# Patient Record
Sex: Female | Born: 1965 | Race: Black or African American | Hispanic: No | Marital: Single | Smoking: Never smoker
Health system: Southern US, Community
[De-identification: ages and names within clinical notes are randomized; demographics above are authoritative.]

## PROBLEM LIST (undated history)

## (undated) DIAGNOSIS — I10 Essential (primary) hypertension: Secondary | ICD-10-CM

## (undated) DIAGNOSIS — D649 Anemia, unspecified: Secondary | ICD-10-CM

## (undated) DIAGNOSIS — M76899 Other specified enthesopathies of unspecified lower limb, excluding foot: Secondary | ICD-10-CM

## (undated) DIAGNOSIS — Z87442 Personal history of urinary calculi: Secondary | ICD-10-CM

## (undated) DIAGNOSIS — E119 Type 2 diabetes mellitus without complications: Secondary | ICD-10-CM

## (undated) DIAGNOSIS — R569 Unspecified convulsions: Secondary | ICD-10-CM

## (undated) DIAGNOSIS — R519 Headache, unspecified: Secondary | ICD-10-CM

## (undated) DIAGNOSIS — IMO0001 Reserved for inherently not codable concepts without codable children: Secondary | ICD-10-CM

## (undated) DIAGNOSIS — M47816 Spondylosis without myelopathy or radiculopathy, lumbar region: Secondary | ICD-10-CM

## (undated) DIAGNOSIS — M5137 Other intervertebral disc degeneration, lumbosacral region: Secondary | ICD-10-CM

## (undated) DIAGNOSIS — M2170 Unequal limb length (acquired), unspecified site: Secondary | ICD-10-CM

## (undated) DIAGNOSIS — R42 Dizziness and giddiness: Secondary | ICD-10-CM

## (undated) DIAGNOSIS — M51379 Other intervertebral disc degeneration, lumbosacral region without mention of lumbar back pain or lower extremity pain: Secondary | ICD-10-CM

## (undated) DIAGNOSIS — K219 Gastro-esophageal reflux disease without esophagitis: Secondary | ICD-10-CM

## (undated) DIAGNOSIS — F419 Anxiety disorder, unspecified: Secondary | ICD-10-CM

## (undated) DIAGNOSIS — M533 Sacrococcygeal disorders, not elsewhere classified: Secondary | ICD-10-CM

## (undated) HISTORY — DX: Anxiety disorder, unspecified: F41.9

## (undated) HISTORY — DX: Other intervertebral disc degeneration, lumbosacral region: M51.37

## (undated) HISTORY — PX: CARPAL TUNNEL RELEASE: SHX101

## (undated) HISTORY — DX: Essential (primary) hypertension: I10

## (undated) HISTORY — DX: Other intervertebral disc degeneration, lumbosacral region without mention of lumbar back pain or lower extremity pain: M51.379

## (undated) HISTORY — DX: Reserved for inherently not codable concepts without codable children: IMO0001

## (undated) HISTORY — DX: Gastro-esophageal reflux disease without esophagitis: K21.9

## (undated) HISTORY — DX: Dizziness and giddiness: R42

## (undated) HISTORY — PX: CHOLECYSTECTOMY: SHX55

## (undated) HISTORY — PX: HERNIA REPAIR: SHX51

## (undated) HISTORY — DX: Unspecified convulsions: R56.9

## (undated) HISTORY — DX: Unequal limb length (acquired), unspecified site: M21.70

## (undated) HISTORY — PX: ABDOMINAL HYSTERECTOMY: SHX81

## (undated) HISTORY — DX: Sacrococcygeal disorders, not elsewhere classified: M53.3

## (undated) HISTORY — DX: Other specified enthesopathies of unspecified lower limb, excluding foot: M76.899

## (undated) HISTORY — DX: Headache, unspecified: R51.9

## (undated) HISTORY — PX: APPENDECTOMY: SHX54

## (undated) HISTORY — PX: FOOT SURGERY: SHX648

## (undated) HISTORY — DX: Type 2 diabetes mellitus without complications: E11.9

## (undated) HISTORY — DX: Spondylosis without myelopathy or radiculopathy, lumbar region: M47.816

---

## 2002-02-06 ENCOUNTER — Encounter: Admission: RE | Admit: 2002-02-06 | Discharge: 2002-02-06 | Payer: Self-pay | Admitting: Internal Medicine

## 2005-10-31 ENCOUNTER — Encounter: Admission: RE | Admit: 2005-10-31 | Discharge: 2005-10-31 | Payer: Self-pay | Admitting: Family Medicine

## 2005-12-15 ENCOUNTER — Encounter: Admission: RE | Admit: 2005-12-15 | Discharge: 2005-12-15 | Payer: Self-pay | Admitting: Otolaryngology

## 2006-01-18 ENCOUNTER — Ambulatory Visit: Payer: Self-pay | Admitting: Gastroenterology

## 2006-01-20 ENCOUNTER — Ambulatory Visit: Payer: Self-pay | Admitting: Gastroenterology

## 2006-01-20 ENCOUNTER — Encounter: Payer: Self-pay | Admitting: Gastroenterology

## 2006-02-01 ENCOUNTER — Ambulatory Visit (HOSPITAL_COMMUNITY): Admission: RE | Admit: 2006-02-01 | Discharge: 2006-02-01 | Payer: Self-pay | Admitting: Gastroenterology

## 2006-02-14 ENCOUNTER — Ambulatory Visit: Payer: Self-pay | Admitting: Gastroenterology

## 2006-02-14 ENCOUNTER — Ambulatory Visit: Payer: Self-pay | Admitting: Internal Medicine

## 2006-02-21 ENCOUNTER — Ambulatory Visit: Payer: Self-pay | Admitting: Gastroenterology

## 2006-04-05 ENCOUNTER — Ambulatory Visit: Payer: Self-pay | Admitting: Gastroenterology

## 2006-04-06 ENCOUNTER — Encounter (INDEPENDENT_AMBULATORY_CARE_PROVIDER_SITE_OTHER): Payer: Self-pay | Admitting: *Deleted

## 2006-04-06 ENCOUNTER — Ambulatory Visit: Payer: Self-pay | Admitting: Gastroenterology

## 2006-04-18 ENCOUNTER — Encounter (INDEPENDENT_AMBULATORY_CARE_PROVIDER_SITE_OTHER): Payer: Self-pay | Admitting: *Deleted

## 2006-04-18 ENCOUNTER — Inpatient Hospital Stay (HOSPITAL_COMMUNITY): Admission: RE | Admit: 2006-04-18 | Discharge: 2006-04-23 | Payer: Self-pay | Admitting: General Surgery

## 2006-05-04 ENCOUNTER — Ambulatory Visit: Payer: Self-pay | Admitting: Gastroenterology

## 2006-11-01 ENCOUNTER — Ambulatory Visit: Payer: Self-pay | Admitting: Gastroenterology

## 2006-11-01 LAB — CONVERTED CEMR LAB
Alkaline Phosphatase: 70 units/L (ref 39–117)
Basophils Relative: 0.6 % (ref 0.0–1.0)
Bilirubin Urine: NEGATIVE
Bilirubin, Direct: 0.1 mg/dL (ref 0.0–0.3)
CO2: 29 meq/L (ref 19–32)
Creatinine, Ser: 0.7 mg/dL (ref 0.4–1.2)
Eosinophils Relative: 1.7 % (ref 0.0–5.0)
GFR calc Af Amer: 118 mL/min
Glucose, Bld: 82 mg/dL (ref 70–99)
HCT: 35.7 % — ABNORMAL LOW (ref 36.0–46.0)
Hemoglobin: 12.6 g/dL (ref 12.0–15.0)
Leukocytes, UA: NEGATIVE
Lymphocytes Relative: 35 % (ref 12.0–46.0)
Monocytes Absolute: 0.3 10*3/uL (ref 0.2–0.7)
Monocytes Relative: 4.5 % (ref 3.0–11.0)
Neutro Abs: 3.4 10*3/uL (ref 1.4–7.7)
Neutrophils Relative %: 58.2 % (ref 43.0–77.0)
Potassium: 4.1 meq/L (ref 3.5–5.1)
RDW: 13 % (ref 11.5–14.6)
Sodium: 142 meq/L (ref 135–145)
Specific Gravity, Urine: 1.025 (ref 1.000–1.03)
Total Bilirubin: 0.6 mg/dL (ref 0.3–1.2)
Total Protein, Urine: NEGATIVE mg/dL
Total Protein: 7.2 g/dL (ref 6.0–8.3)
Urobilinogen, UA: 0.2 (ref 0.0–1.0)
WBC: 5.9 10*3/uL (ref 4.5–10.5)
pH: 6.5 (ref 5.0–8.0)

## 2006-11-03 ENCOUNTER — Ambulatory Visit: Payer: Self-pay | Admitting: Internal Medicine

## 2007-03-15 ENCOUNTER — Ambulatory Visit: Payer: Self-pay | Admitting: Gastroenterology

## 2007-04-10 ENCOUNTER — Ambulatory Visit: Payer: Self-pay | Admitting: Gastroenterology

## 2007-04-12 ENCOUNTER — Encounter: Payer: Self-pay | Admitting: *Deleted

## 2007-04-12 ENCOUNTER — Encounter: Payer: Self-pay | Admitting: Endocrinology

## 2007-04-12 ENCOUNTER — Ambulatory Visit: Payer: Self-pay | Admitting: Endocrinology

## 2007-04-12 DIAGNOSIS — E785 Hyperlipidemia, unspecified: Secondary | ICD-10-CM | POA: Insufficient documentation

## 2007-04-12 DIAGNOSIS — D649 Anemia, unspecified: Secondary | ICD-10-CM

## 2007-04-12 DIAGNOSIS — Z860100 Personal history of colon polyps, unspecified: Secondary | ICD-10-CM | POA: Insufficient documentation

## 2007-04-12 DIAGNOSIS — R5382 Chronic fatigue, unspecified: Secondary | ICD-10-CM

## 2007-04-12 DIAGNOSIS — R7309 Other abnormal glucose: Secondary | ICD-10-CM

## 2007-04-12 DIAGNOSIS — E229 Hyperfunction of pituitary gland, unspecified: Secondary | ICD-10-CM

## 2007-04-12 DIAGNOSIS — E039 Hypothyroidism, unspecified: Secondary | ICD-10-CM | POA: Insufficient documentation

## 2007-05-09 ENCOUNTER — Ambulatory Visit: Payer: Self-pay | Admitting: Endocrinology

## 2007-05-11 LAB — CONVERTED CEMR LAB
Sed Rate: 17 mm/hr (ref 0–25)
TSH: 0.24 microintl units/mL — ABNORMAL LOW (ref 0.35–5.50)

## 2007-05-16 ENCOUNTER — Telehealth (INDEPENDENT_AMBULATORY_CARE_PROVIDER_SITE_OTHER): Payer: Self-pay | Admitting: *Deleted

## 2007-05-17 ENCOUNTER — Ambulatory Visit: Payer: Self-pay | Admitting: Endocrinology

## 2007-05-18 LAB — CONVERTED CEMR LAB
ALT: 15 units/L (ref 0–35)
AST: 17 units/L (ref 0–37)
Alkaline Phosphatase: 86 units/L (ref 39–117)
BUN: 8 mg/dL (ref 6–23)
Basophils Relative: 0 % (ref 0.0–1.0)
Bilirubin Urine: NEGATIVE
Bilirubin, Direct: 0.1 mg/dL (ref 0.0–0.3)
CO2: 32 meq/L (ref 19–32)
Calcium: 10.1 mg/dL (ref 8.4–10.5)
Creatinine, Ser: 0.6 mg/dL (ref 0.4–1.2)
Eosinophils Relative: 1.9 % (ref 0.0–5.0)
Glucose, Bld: 90 mg/dL (ref 70–99)
Hemoglobin: 13.4 g/dL (ref 12.0–15.0)
Leukocytes, UA: NEGATIVE
Monocytes Relative: 3.5 % (ref 3.0–11.0)
Nitrite: NEGATIVE
Platelets: 264 10*3/uL (ref 150–400)
RDW: 12.2 % (ref 11.5–14.6)
Specific Gravity, Urine: 1.02 (ref 1.000–1.03)
Total Bilirubin: 0.7 mg/dL (ref 0.3–1.2)
Total Protein, Urine: NEGATIVE mg/dL
Total Protein: 8 g/dL (ref 6.0–8.3)
Triglycerides: 122 mg/dL (ref 0–149)
Urine Glucose: NEGATIVE mg/dL
VLDL: 24 mg/dL (ref 0–40)
WBC: 8.4 10*3/uL (ref 4.5–10.5)
pH: 7 (ref 5.0–8.0)

## 2007-05-22 ENCOUNTER — Encounter: Payer: Self-pay | Admitting: Endocrinology

## 2007-05-22 ENCOUNTER — Other Ambulatory Visit: Admission: RE | Admit: 2007-05-22 | Discharge: 2007-05-22 | Payer: Self-pay | Admitting: Endocrinology

## 2007-05-22 ENCOUNTER — Telehealth (INDEPENDENT_AMBULATORY_CARE_PROVIDER_SITE_OTHER): Payer: Self-pay | Admitting: *Deleted

## 2007-05-22 ENCOUNTER — Ambulatory Visit: Payer: Self-pay | Admitting: Endocrinology

## 2007-05-22 DIAGNOSIS — J069 Acute upper respiratory infection, unspecified: Secondary | ICD-10-CM | POA: Insufficient documentation

## 2007-05-28 ENCOUNTER — Ambulatory Visit: Payer: Self-pay | Admitting: Endocrinology

## 2007-05-28 ENCOUNTER — Telehealth (INDEPENDENT_AMBULATORY_CARE_PROVIDER_SITE_OTHER): Payer: Self-pay | Admitting: *Deleted

## 2007-05-28 LAB — CONVERTED CEMR LAB
Basophils Relative: 0.1 % (ref 0.0–1.0)
Eosinophils Relative: 1.9 % (ref 0.0–5.0)
HCT: 36 % (ref 36.0–46.0)
Hemoglobin: 12.2 g/dL (ref 12.0–15.0)
Iron: 91 ug/dL (ref 42–145)
Lymphocytes Relative: 37.5 % (ref 12.0–46.0)
Monocytes Absolute: 0.2 10*3/uL (ref 0.2–0.7)
Monocytes Relative: 2.6 % — ABNORMAL LOW (ref 3.0–11.0)
Neutro Abs: 3.8 10*3/uL (ref 1.4–7.7)
Neutrophils Relative %: 57.9 % (ref 43.0–77.0)
RDW: 12.3 % (ref 11.5–14.6)

## 2007-06-18 ENCOUNTER — Ambulatory Visit: Payer: Self-pay | Admitting: Endocrinology

## 2007-06-21 LAB — CONVERTED CEMR LAB: TSH: 1.37 microintl units/mL (ref 0.35–5.50)

## 2007-07-17 ENCOUNTER — Encounter: Payer: Self-pay | Admitting: Internal Medicine

## 2007-07-19 ENCOUNTER — Telehealth (INDEPENDENT_AMBULATORY_CARE_PROVIDER_SITE_OTHER): Payer: Self-pay | Admitting: *Deleted

## 2007-07-20 ENCOUNTER — Ambulatory Visit: Payer: Self-pay | Admitting: Internal Medicine

## 2007-07-20 DIAGNOSIS — M546 Pain in thoracic spine: Secondary | ICD-10-CM

## 2007-07-20 DIAGNOSIS — K811 Chronic cholecystitis: Secondary | ICD-10-CM | POA: Insufficient documentation

## 2007-07-20 DIAGNOSIS — Z87442 Personal history of urinary calculi: Secondary | ICD-10-CM

## 2007-07-20 DIAGNOSIS — IMO0002 Reserved for concepts with insufficient information to code with codable children: Secondary | ICD-10-CM

## 2007-07-23 LAB — CONVERTED CEMR LAB
Basophils Relative: 0 % (ref 0.0–1.0)
Crystals: NEGATIVE
Eosinophils Absolute: 0.1 10*3/uL (ref 0.0–0.6)
Eosinophils Relative: 1.2 % (ref 0.0–5.0)
Folate: 9.7 ng/mL
HCT: 36.1 % (ref 36.0–46.0)
Hemoglobin, Urine: NEGATIVE
Hemoglobin: 12.1 g/dL (ref 12.0–15.0)
Leukocytes, UA: NEGATIVE
Lymphocytes Relative: 46.2 % — ABNORMAL HIGH (ref 12.0–46.0)
MCV: 88.2 fL (ref 78.0–100.0)
Monocytes Absolute: 0.7 10*3/uL (ref 0.2–0.7)
Neutro Abs: 3.5 10*3/uL (ref 1.4–7.7)
Neutrophils Relative %: 44.3 % (ref 43.0–77.0)
Platelets: 211 10*3/uL (ref 150–400)
Saturation Ratios: 15.7 % — ABNORMAL LOW (ref 20.0–50.0)
Total Protein, Urine: NEGATIVE mg/dL
Transferrin: 290.3 mg/dL (ref >=212.0)
Urine Glucose: NEGATIVE mg/dL
Urobilinogen, UA: 0.2 (ref 0.0–1.0)
Vitamin B-12: 322 pg/mL (ref 211–911)
WBC: 8 10*3/uL (ref 4.5–10.5)

## 2007-07-25 ENCOUNTER — Encounter: Payer: Self-pay | Admitting: Internal Medicine

## 2007-08-03 ENCOUNTER — Encounter: Admission: RE | Admit: 2007-08-03 | Discharge: 2007-08-03 | Payer: Self-pay | Admitting: Internal Medicine

## 2007-08-06 ENCOUNTER — Telehealth: Payer: Self-pay | Admitting: Internal Medicine

## 2007-08-07 ENCOUNTER — Telehealth: Payer: Self-pay | Admitting: Internal Medicine

## 2007-08-08 ENCOUNTER — Encounter: Admission: RE | Admit: 2007-08-08 | Discharge: 2007-08-08 | Payer: Self-pay | Admitting: Specialist

## 2007-08-10 ENCOUNTER — Ambulatory Visit: Payer: Self-pay | Admitting: Endocrinology

## 2007-08-13 LAB — CONVERTED CEMR LAB: TSH: 1.91 microintl units/mL (ref 0.35–5.50)

## 2007-08-17 ENCOUNTER — Encounter: Payer: Self-pay | Admitting: Internal Medicine

## 2007-08-17 ENCOUNTER — Telehealth (INDEPENDENT_AMBULATORY_CARE_PROVIDER_SITE_OTHER): Payer: Self-pay | Admitting: *Deleted

## 2007-08-22 ENCOUNTER — Encounter: Payer: Self-pay | Admitting: Internal Medicine

## 2007-09-05 ENCOUNTER — Telehealth (INDEPENDENT_AMBULATORY_CARE_PROVIDER_SITE_OTHER): Payer: Self-pay | Admitting: *Deleted

## 2007-09-11 ENCOUNTER — Ambulatory Visit: Payer: Self-pay | Admitting: Internal Medicine

## 2007-09-11 DIAGNOSIS — F411 Generalized anxiety disorder: Secondary | ICD-10-CM | POA: Insufficient documentation

## 2007-09-11 DIAGNOSIS — M2550 Pain in unspecified joint: Secondary | ICD-10-CM | POA: Insufficient documentation

## 2007-09-11 DIAGNOSIS — F329 Major depressive disorder, single episode, unspecified: Secondary | ICD-10-CM

## 2007-09-13 ENCOUNTER — Encounter: Payer: Self-pay | Admitting: Internal Medicine

## 2007-10-03 ENCOUNTER — Encounter
Admission: RE | Admit: 2007-10-03 | Discharge: 2008-01-01 | Payer: Self-pay | Admitting: Physical Medicine & Rehabilitation

## 2007-10-04 ENCOUNTER — Telehealth (INDEPENDENT_AMBULATORY_CARE_PROVIDER_SITE_OTHER): Payer: Self-pay | Admitting: *Deleted

## 2007-10-05 ENCOUNTER — Ambulatory Visit: Payer: Self-pay | Admitting: Physical Medicine & Rehabilitation

## 2007-11-12 ENCOUNTER — Ambulatory Visit: Payer: Self-pay | Admitting: Physical Medicine & Rehabilitation

## 2007-12-13 ENCOUNTER — Ambulatory Visit: Payer: Self-pay | Admitting: Physical Medicine & Rehabilitation

## 2007-12-13 ENCOUNTER — Encounter
Admission: RE | Admit: 2007-12-13 | Discharge: 2008-03-12 | Payer: Self-pay | Admitting: Physical Medicine & Rehabilitation

## 2008-01-07 ENCOUNTER — Ambulatory Visit: Payer: Self-pay | Admitting: Physical Medicine & Rehabilitation

## 2008-01-31 ENCOUNTER — Ambulatory Visit: Payer: Self-pay | Admitting: Physical Medicine & Rehabilitation

## 2008-02-28 ENCOUNTER — Ambulatory Visit: Payer: Self-pay | Admitting: Physical Medicine & Rehabilitation

## 2008-03-31 ENCOUNTER — Encounter
Admission: RE | Admit: 2008-03-31 | Discharge: 2008-06-29 | Payer: Self-pay | Admitting: Physical Medicine & Rehabilitation

## 2008-04-01 ENCOUNTER — Ambulatory Visit: Payer: Self-pay | Admitting: Physical Medicine & Rehabilitation

## 2008-04-29 ENCOUNTER — Ambulatory Visit: Payer: Self-pay | Admitting: Physical Medicine & Rehabilitation

## 2008-05-28 ENCOUNTER — Ambulatory Visit: Payer: Self-pay | Admitting: Physical Medicine & Rehabilitation

## 2008-06-30 ENCOUNTER — Ambulatory Visit: Payer: Self-pay | Admitting: Physical Medicine & Rehabilitation

## 2008-06-30 ENCOUNTER — Encounter
Admission: RE | Admit: 2008-06-30 | Discharge: 2008-06-30 | Payer: Self-pay | Admitting: Physical Medicine & Rehabilitation

## 2008-08-28 ENCOUNTER — Encounter
Admission: RE | Admit: 2008-08-28 | Discharge: 2008-11-26 | Payer: Self-pay | Admitting: Physical Medicine & Rehabilitation

## 2008-08-29 ENCOUNTER — Ambulatory Visit: Payer: Self-pay | Admitting: Physical Medicine & Rehabilitation

## 2008-09-30 ENCOUNTER — Ambulatory Visit: Payer: Self-pay | Admitting: Physical Medicine & Rehabilitation

## 2008-10-29 ENCOUNTER — Ambulatory Visit: Payer: Self-pay | Admitting: Physical Medicine & Rehabilitation

## 2008-12-02 ENCOUNTER — Encounter
Admission: RE | Admit: 2008-12-02 | Discharge: 2009-03-02 | Payer: Self-pay | Admitting: Physical Medicine & Rehabilitation

## 2008-12-05 ENCOUNTER — Ambulatory Visit: Payer: Self-pay | Admitting: Physical Medicine & Rehabilitation

## 2008-12-11 ENCOUNTER — Ambulatory Visit (HOSPITAL_COMMUNITY)
Admission: RE | Admit: 2008-12-11 | Discharge: 2008-12-11 | Payer: Self-pay | Admitting: Physical Medicine & Rehabilitation

## 2008-12-26 ENCOUNTER — Ambulatory Visit: Payer: Self-pay | Admitting: Physical Medicine & Rehabilitation

## 2009-01-23 ENCOUNTER — Ambulatory Visit: Payer: Self-pay | Admitting: Physical Medicine & Rehabilitation

## 2009-02-18 ENCOUNTER — Ambulatory Visit: Payer: Self-pay | Admitting: Physical Medicine & Rehabilitation

## 2009-03-11 ENCOUNTER — Encounter
Admission: RE | Admit: 2009-03-11 | Discharge: 2009-06-09 | Payer: Self-pay | Admitting: Physical Medicine & Rehabilitation

## 2009-03-18 ENCOUNTER — Ambulatory Visit: Payer: Self-pay | Admitting: Physical Medicine & Rehabilitation

## 2009-03-19 ENCOUNTER — Encounter (INDEPENDENT_AMBULATORY_CARE_PROVIDER_SITE_OTHER): Payer: Self-pay | Admitting: *Deleted

## 2009-04-27 ENCOUNTER — Ambulatory Visit: Payer: Self-pay | Admitting: Physical Medicine & Rehabilitation

## 2009-05-15 ENCOUNTER — Encounter
Admission: RE | Admit: 2009-05-15 | Discharge: 2009-06-24 | Payer: Self-pay | Admitting: Physical Medicine & Rehabilitation

## 2009-05-18 ENCOUNTER — Ambulatory Visit: Payer: Self-pay | Admitting: Physical Medicine & Rehabilitation

## 2009-06-16 ENCOUNTER — Encounter
Admission: RE | Admit: 2009-06-16 | Discharge: 2009-06-24 | Payer: Self-pay | Admitting: Physical Medicine & Rehabilitation

## 2009-06-19 ENCOUNTER — Ambulatory Visit: Payer: Self-pay | Admitting: Physical Medicine & Rehabilitation

## 2009-07-16 ENCOUNTER — Encounter
Admission: RE | Admit: 2009-07-16 | Discharge: 2009-10-14 | Payer: Self-pay | Admitting: Physical Medicine & Rehabilitation

## 2009-07-17 ENCOUNTER — Ambulatory Visit: Payer: Self-pay | Admitting: Physical Medicine & Rehabilitation

## 2009-08-14 ENCOUNTER — Ambulatory Visit: Payer: Self-pay | Admitting: Physical Medicine & Rehabilitation

## 2009-09-10 ENCOUNTER — Encounter
Admission: RE | Admit: 2009-09-10 | Discharge: 2009-12-09 | Payer: Self-pay | Admitting: Physical Medicine & Rehabilitation

## 2009-09-14 ENCOUNTER — Ambulatory Visit: Payer: Self-pay | Admitting: Physical Medicine & Rehabilitation

## 2009-10-21 ENCOUNTER — Ambulatory Visit: Payer: Self-pay | Admitting: Physical Medicine & Rehabilitation

## 2009-11-19 ENCOUNTER — Ambulatory Visit: Payer: Self-pay | Admitting: Physical Medicine & Rehabilitation

## 2009-11-20 ENCOUNTER — Telehealth: Payer: Self-pay | Admitting: Gastroenterology

## 2009-12-18 ENCOUNTER — Encounter
Admission: RE | Admit: 2009-12-18 | Discharge: 2010-01-18 | Payer: Self-pay | Admitting: Physical Medicine & Rehabilitation

## 2009-12-23 ENCOUNTER — Ambulatory Visit: Payer: Self-pay | Admitting: Physical Medicine & Rehabilitation

## 2010-01-18 ENCOUNTER — Ambulatory Visit: Payer: Self-pay | Admitting: Physical Medicine & Rehabilitation

## 2010-02-09 ENCOUNTER — Encounter
Admission: RE | Admit: 2010-02-09 | Discharge: 2010-03-17 | Payer: Self-pay | Source: Home / Self Care | Admitting: Physical Medicine & Rehabilitation

## 2010-02-18 ENCOUNTER — Ambulatory Visit: Payer: Self-pay | Admitting: Physical Medicine & Rehabilitation

## 2010-03-17 ENCOUNTER — Encounter
Admission: RE | Admit: 2010-03-17 | Discharge: 2010-06-15 | Payer: Self-pay | Source: Home / Self Care | Attending: Physical Medicine & Rehabilitation | Admitting: Physical Medicine & Rehabilitation

## 2010-03-23 ENCOUNTER — Ambulatory Visit: Payer: Self-pay | Admitting: Physical Medicine & Rehabilitation

## 2010-04-22 ENCOUNTER — Ambulatory Visit: Payer: Self-pay | Admitting: Physical Medicine & Rehabilitation

## 2010-05-18 ENCOUNTER — Ambulatory Visit: Payer: Self-pay | Admitting: Physical Medicine & Rehabilitation

## 2010-06-17 ENCOUNTER — Encounter
Admission: RE | Admit: 2010-06-17 | Discharge: 2010-07-19 | Payer: Self-pay | Source: Home / Self Care | Attending: Physical Medicine & Rehabilitation | Admitting: Physical Medicine & Rehabilitation

## 2010-06-17 ENCOUNTER — Ambulatory Visit: Payer: Self-pay | Admitting: Physical Medicine & Rehabilitation

## 2010-07-19 ENCOUNTER — Encounter
Admission: RE | Admit: 2010-07-19 | Discharge: 2010-07-21 | Payer: Self-pay | Source: Home / Self Care | Attending: Physical Medicine & Rehabilitation | Admitting: Physical Medicine & Rehabilitation

## 2010-07-21 ENCOUNTER — Ambulatory Visit
Admission: RE | Admit: 2010-07-21 | Discharge: 2010-07-21 | Payer: Self-pay | Source: Home / Self Care | Attending: Physical Medicine & Rehabilitation | Admitting: Physical Medicine & Rehabilitation

## 2010-08-01 LAB — CONVERTED CEMR LAB
AST: 16 units/L (ref 0–37)
Albumin: 3.6 g/dL (ref 3.5–5.2)
Basophils Absolute: 0 10*3/uL (ref 0.0–0.1)
Bilirubin, Direct: 0.1 mg/dL (ref 0.0–0.3)
Calcium: 9 mg/dL (ref 8.4–10.5)
Chloride: 101 meq/L (ref 96–112)
Eosinophils Absolute: 0.2 10*3/uL (ref 0.0–0.6)
GFR calc Af Amer: 118 mL/min
GFR calc non Af Amer: 98 mL/min
HCT: 33.2 % — ABNORMAL LOW (ref 36.0–46.0)
HDL: 47.6 mg/dL (ref >39.0)
Hemoglobin: 11.7 g/dL — ABNORMAL LOW (ref 12.0–15.0)
LDL Cholesterol: 77 mg/dL (ref 0–99)
Neutro Abs: 6.5 10*3/uL (ref 1.4–7.7)
Neutrophils Relative %: 69.5 % (ref 43.0–77.0)
Platelets: 221 10*3/uL (ref 150–400)
RBC: 3.7 M/uL — ABNORMAL LOW (ref 3.87–5.11)
Sodium: 139 meq/L (ref 135–145)
TSH: 0.64 microintl units/mL (ref 0.35–5.50)
VLDL: 18 mg/dL (ref 0–40)
WBC: 9.3 10*3/uL (ref 4.5–10.5)

## 2010-08-03 NOTE — Progress Notes (Signed)
Summary: CHANGE TO [facility]---Colonoscopy   Phone Note Outgoing Call   Call placed by: Lamona Curl CMA Duncan Dull),  Nov 20, 2009 1:13 PM Call placed to: Patient Summary of Call: I have spoken to patient to advise her that she is overdue for her colonoscopy. Patient has history of large adenomatous colon polyp and hemicolectomy. Patient states that she has had surgery recently and that she sees a GI Dr at [facility]. I have advised her that she should follow up with them for her colonoscopy. Patient states that she never got our letter and would like [place] to send her a copy of the original so she can take it to [facility]. I have updated her address and will send out the letter to her.  Initial call taken by: Lamona Curl CMA (AAMA),  Nov 20, 2009 1:17 PM

## 2010-08-19 ENCOUNTER — Ambulatory Visit (HOSPITAL_BASED_OUTPATIENT_CLINIC_OR_DEPARTMENT_OTHER): Payer: Self-pay

## 2010-08-19 ENCOUNTER — Encounter: Payer: Medicare Other | Attending: Physical Medicine & Rehabilitation

## 2010-08-19 DIAGNOSIS — M2170 Unequal limb length (acquired), unspecified site: Secondary | ICD-10-CM

## 2010-08-19 DIAGNOSIS — M129 Arthropathy, unspecified: Secondary | ICD-10-CM | POA: Insufficient documentation

## 2010-08-19 DIAGNOSIS — M79609 Pain in unspecified limb: Secondary | ICD-10-CM | POA: Insufficient documentation

## 2010-08-19 DIAGNOSIS — IMO0001 Reserved for inherently not codable concepts without codable children: Secondary | ICD-10-CM | POA: Insufficient documentation

## 2010-08-19 DIAGNOSIS — M5137 Other intervertebral disc degeneration, lumbosacral region: Secondary | ICD-10-CM

## 2010-08-19 DIAGNOSIS — M76899 Other specified enthesopathies of unspecified lower limb, excluding foot: Secondary | ICD-10-CM

## 2010-08-19 DIAGNOSIS — M7660 Achilles tendinitis, unspecified leg: Secondary | ICD-10-CM | POA: Insufficient documentation

## 2010-08-19 DIAGNOSIS — G8929 Other chronic pain: Secondary | ICD-10-CM | POA: Insufficient documentation

## 2010-08-19 DIAGNOSIS — M533 Sacrococcygeal disorders, not elsewhere classified: Secondary | ICD-10-CM

## 2010-09-16 ENCOUNTER — Ambulatory Visit: Payer: Medicare Other

## 2010-09-16 ENCOUNTER — Encounter: Payer: Medicare Other | Attending: Physical Medicine & Rehabilitation

## 2010-09-16 DIAGNOSIS — Z981 Arthrodesis status: Secondary | ICD-10-CM | POA: Insufficient documentation

## 2010-09-16 DIAGNOSIS — M5137 Other intervertebral disc degeneration, lumbosacral region: Secondary | ICD-10-CM

## 2010-09-16 DIAGNOSIS — M545 Low back pain, unspecified: Secondary | ICD-10-CM | POA: Insufficient documentation

## 2010-09-16 DIAGNOSIS — M51379 Other intervertebral disc degeneration, lumbosacral region without mention of lumbar back pain or lower extremity pain: Secondary | ICD-10-CM

## 2010-09-16 DIAGNOSIS — M2170 Unequal limb length (acquired), unspecified site: Secondary | ICD-10-CM

## 2010-09-16 DIAGNOSIS — M7660 Achilles tendinitis, unspecified leg: Secondary | ICD-10-CM | POA: Insufficient documentation

## 2010-09-16 DIAGNOSIS — M79609 Pain in unspecified limb: Secondary | ICD-10-CM | POA: Insufficient documentation

## 2010-09-16 DIAGNOSIS — M76899 Other specified enthesopathies of unspecified lower limb, excluding foot: Secondary | ICD-10-CM

## 2010-09-16 DIAGNOSIS — M533 Sacrococcygeal disorders, not elsewhere classified: Secondary | ICD-10-CM

## 2010-09-16 DIAGNOSIS — IMO0001 Reserved for inherently not codable concepts without codable children: Secondary | ICD-10-CM | POA: Insufficient documentation

## 2010-09-16 DIAGNOSIS — M129 Arthropathy, unspecified: Secondary | ICD-10-CM | POA: Insufficient documentation

## 2010-09-16 DIAGNOSIS — G8929 Other chronic pain: Secondary | ICD-10-CM | POA: Insufficient documentation

## 2010-10-20 ENCOUNTER — Encounter: Payer: Medicare Other | Attending: Physical Medicine & Rehabilitation | Admitting: Physical Medicine & Rehabilitation

## 2010-10-20 DIAGNOSIS — M722 Plantar fascial fibromatosis: Secondary | ICD-10-CM | POA: Insufficient documentation

## 2010-10-20 DIAGNOSIS — M7660 Achilles tendinitis, unspecified leg: Secondary | ICD-10-CM | POA: Insufficient documentation

## 2010-10-20 DIAGNOSIS — M129 Arthropathy, unspecified: Secondary | ICD-10-CM | POA: Insufficient documentation

## 2010-10-20 DIAGNOSIS — M76899 Other specified enthesopathies of unspecified lower limb, excluding foot: Secondary | ICD-10-CM

## 2010-10-20 DIAGNOSIS — M2170 Unequal limb length (acquired), unspecified site: Secondary | ICD-10-CM

## 2010-10-20 DIAGNOSIS — M5137 Other intervertebral disc degeneration, lumbosacral region: Secondary | ICD-10-CM

## 2010-10-20 DIAGNOSIS — M545 Low back pain, unspecified: Secondary | ICD-10-CM | POA: Insufficient documentation

## 2010-10-20 DIAGNOSIS — IMO0001 Reserved for inherently not codable concepts without codable children: Secondary | ICD-10-CM | POA: Insufficient documentation

## 2010-10-20 DIAGNOSIS — G8929 Other chronic pain: Secondary | ICD-10-CM | POA: Insufficient documentation

## 2010-10-20 DIAGNOSIS — M533 Sacrococcygeal disorders, not elsewhere classified: Secondary | ICD-10-CM

## 2010-10-20 DIAGNOSIS — M79609 Pain in unspecified limb: Secondary | ICD-10-CM | POA: Insufficient documentation

## 2010-10-21 NOTE — Assessment & Plan Note (Signed)
Kathy [name] is back regarding her chronic low back pain and fibromyalgia.  She has been having some problems with constipation and Surgery has talked to her about an ostomy.  She really has not been regular with her bowel program.  She recently had her Opana ET filled and states that the formulation was changed and she is having nausea.  She has been on only for 2 days.  She is also suffering from sinus allergies and issues related to the medication side effects too.  She uses oxycodone for breakthrough pain.  Her pain in the neck as well as low back and to legs.  She is having surgery done on a cyst on the left wrist as well as for potential carpal tunnel syndrome.  I am not privy to the details there.  REVIEW OF SYSTEMS:  Notable for the above.  She also reports some abdominal pain, poor appetite, fever, chills, weakness.  Full 12-point review is in the written health and history section of the chart.  SOCIAL HISTORY:  Unchanged.  She is single living with mother and brother.  She does not smoke or drink.  PHYSICAL EXAMINATION:  VITAL SIGNS:  Blood pressure is 120/84, pulse 52, respiratory rate 18, she is satting 98% on room air. GENERAL:  The patient is pleasant, alert. MUSCULOSKELETAL:  She has had pain over the right heel.  She has generalized tenderness over the lumbar paraspinals worse with flexion and palpation, right more than left.  She has fair posture.  She remains overweight.  She has abdominal pain along the surgical site.  Muscle tone is poor there.  She seemed very sleepy today as well falling asleep in the room while we talked. HEART:  Regular. CHEST:  Clear. ABDOMEN:  Soft, nontender.  ASSESSMENT: 1. Chronic low back pain related lumbar facet arthropathy. 2. Fibromyalgia. 3. Right Achilles tendinopathy and right heel pain/plantar fasciitis. 4. Multiple bowel surgeries with obstruction and colectomy. 5. History of interstitial cystitis.  PLAN: 1. Reviewed regular  bowel medication regimens with her which she     agreed to try to see if we can get her on a regular schedule. 2. Pilates and low back exercises. 3. Before we abandon the Opana, I would like to see if some of her     problems are more related to side effects or antihistamines on     recent allergy problems as well.  She had Roxicodone IR 15 and the     Opana ER 10 mg filled only a few days ago. 4. We will see her back here in about a month.  She will call me with     any problems or further nausea symptoms in a week or so. 5. Recommended continue use of heel cup for right foot.     Ranelle Oyster, M.D. Electronically Signed   ZTS/MedQ D:  10/20/2010 14:14:43  T:  10/21/2010 01:19:15  Job #:  [id number]

## 2010-11-16 DIAGNOSIS — M5430 Sciatica, unspecified side: Secondary | ICD-10-CM

## 2010-11-16 NOTE — Procedures (Signed)
Kathy [name], PONCIANO NO.:  [id number]   MEDICAL RECORD NO.:  [id number]          PATIENT TYPE:  REC   LOCATION:  [facility]                          FACILITY:  [facility]   PHYSICIAN:  Erick Colace, M.D.DATE OF BIRTH:  11/13/1965   DATE OF PROCEDURE:  01/07/2008  DATE OF DISCHARGE:                               OPERATIVE REPORT   PROCEDURE:  Bilateral L5 dorsal ramus injection, bilateral L4 medial  branch block, bilateral L3 medial branch block, and bilateral L2 medial  branch block under fluoroscopic guidance.   INDICATION:  Lumbar spondylosis, with previous good result from medial  branch blocks performed on December 13, 2007, with a 50% relief of pain  lasting for several days.   We did the right side the last time.  She feels like the left side is  also affected and would like to have this side trialed as well.  Pain  does interfere with self-care mobility and her job as a Lawyer, only  partially responsive to medication management.   PROCEDURE:  Informed consent was obtained after describing risks and  benefits of the procedure with the patient.  These include bleeding,  bruising, and infection.  She elected to proceed and has given written  consent.  The patient placed prone on fluoroscopy table.  Betadine prep  and sterile drape.  A 25-gauge 1-1/2-inch half needle was used to  anesthetize the skin and subcutaneous tissue with 1% lidocaine x2 mL,  then a 22-gauge 3-1/2-inch spinal needle was inserted under fluoroscopic  guidance, first starting in the left S1 SAP sacroiliac junction.  Bone  contact made and confirmed with lateral imaging.  Omnipaque 180 x0.5 mL  demonstrated no intravascular uptake, then 0.5 mL of solution containing  1 mL of 4 mg/mL dexamethasone and 4 mL of 1% MPF lidocaine.  Then, the  left L5 SAP-transverse process junction was targeted.  Bone contact made  and confirmed with lateral imaging.  Omnipaque 180 x0.5 mL demonstrated  no  intravascular uptake, then 0.5 mL of dexamethasone-lidocaine solution  was injected.  Then, the left L4 SAP-transverse process junction was  targeted.  Bone contact made and confirmed with lateral imaging.  Omnipaque 180 x0.5 mL demonstrated no intravascular uptake, and 0.5 mL  of  dexamethasone-lidocaine solution was injected.  Then, the left L3  SAP-transverse process junction was targeted.  Bone contact made and  confirmed with lateral imaging.  Omnipaque 180 x0.5 mL demonstrated no  intravascular uptake and 0.5 mL of dexamethasone-lidocaine solution  injected.  Then, the L2 SAP-transverse process junction targeted.  Bone  contact made and confirmed with lateral imaging.  Omnipaque 180 x0.5 mL  demonstrated no intravascular uptake, then 0.5 mL of dexamethasone-  lidocaine solution was injected.  The patient tolerated the procedure  well.  This same procedure were repeated on the right side at  corresponding levels using the same needle, injectate, and technique.  The patient tolerated the procedure well.  Pre- and post-injection  vitals were stable.  If this has efficacy similar to prior procedure, we  would set her up with right lumbar  RF at corresponding levels.      Erick Colace, M.D.  Electronically Signed    AEK/MEDQ  D:  01/07/2008 14:45:33  T:  01/08/2008 02:30:29  Job:  [id number]

## 2010-11-16 NOTE — Assessment & Plan Note (Signed)
It has been a few months since I have seen Kathy [name] last.  She has seen  with Dr. Wynn Banker for a series of lumbar medial branch blocks and RFs.  She had some temporary relief with the medial branch blocks, but she  states that the RFs helped her for about a week and week and a half, but  her pain has recurred in the low back.  She is also having some pain in  the legs, which we documented in the past.  I believe, I saw her last on  Nov 12, 2007, at which time we sent her to Dr. Wynn Banker for treatment.  She also reports over the last month and a half that she had further GI  problems and had a small bowel obstruction with partial removal of her  small intestine.  She was in the hospital for several days for this.  She has not been able to exercise much.  She remains weak, particularly  in her trunk, due to her surgery and really just not healing.  She has  had some ongoing problems with her bowel habits as a result.  She has  generalized pain in the abdomen as well as low back into the legs.  She  also complains of pain in the hands and wrists as well as the neck.  Her  family physician with thoughts of fibromyalgia in mind placed her on  Savella a month ago as well as Wellbutrin at 50 b.i.d. and 300 mg daily  respectively.  She really notes no difference with the two on board.  She had tried Lyrica, I believe in the past, unsuccessfully.  She is  using oxycodone for breakthrough pain 5 mg 1 every 12 hours p.r.n., but  does not know if this is helping much anymore.  She has tried Lidoderm  patches as well as Voltaren Gel, which helps somewhat with her low back  pain.   The patient rates her pain a 9/10 at a 10 on average.  She describes it  as sharp, burning, stabbing, tingling, and aching.  It interferes with  general activity, in relationship with others, and enjoyment of life on  a moderate-to-severe level.  Sleep is poor-to-fair.  She is limited with  walking, bending, sitting, and  standing as well as household activities.  She can walk about 10-20 minutes without having to stop in pain.  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   REVIEW OF SYSTEMS:  Notable for numbness, tremor, tingling, spasm,  dizziness, confusion, depression, loss of taste, weight gain, night  sweats, skin rash, wounds per her surgery, diarrhea, constipation,  abdominal pain, poor appetite, limb swelling of the foot and hand in  particular.  She reports some wheezing.  Full review is in the written  health and history section.   SOCIAL HISTORY:  The patient is single, living with her mother  currently.   PHYSICAL EXAMINATION:  VITAL SIGNS:  Blood pressure is 138/85, pulse is  97, and respiratory rate 18.  She is sating 100% on room air.  GENERAL:  The patient is pleasant, alert, and oriented x3.  She feels a  bit withdrawn, but overall is appropriate.  She has pain in the abdomen  today.  The wound is well healed, although she has obviously some  weakness in her abdominal musculature.  Low back is tender along the  lumbar paraspinals with flexion and extension.  She has pain  with 45  degrees of flexion and extension pain about 15 degrees today.  She had  some pain down to the PSIS area, but more of the pain was in the L5-L3  levels.  Strength was generally intact, although she had some  generalized tenderness in both upper and lower extremity limbs today.  Sensory exam was nonfocal.  She walks with a shuffling type of gait.  Cognitively, she is intact.  HEART:  Regular.  CHEST:  Clear.  ABDOMEN:  Generally soft with noted changes above.   ASSESSMENT:  1. Chronic low back and leg pain related to lumbar degenerative disk      disease and facet arthropathy.  2. Right great trochanter bursitis.  3. Obesity.  4. Multiple bowel surgeries due to adhesions, etc.  5. Hypertension.  6. Possible fibromyalgia syndrome.   PLAN:  1. I think that Savella and  Wellbutrin are reasonable ideas to treat      her centralized pain, although she has not had much effect as of      yet.  I do have some concerns of her being on Wellbutrin and the      Savella together at those high doses.  She is not having any ill      effects to this point, but she will be on a lookout.  2. We will start her on long-acting agent fentanyl patch 25 mcg every      72 hours to treat her baseline back pain and abdominal symptoms.      She will have to be very vigilant with her bowel program.  She is      taking Amitiza now per her GI team, which she will need to      continue.  She will also need look at stool softeners and laxatives      as well potentially.  3. Refill oxycodone 5 mg 1 every 8-12 hours p.r.n.  4. We will get her an outpatient physical therapy at [facility]      to work on core muscle, low back, pelvic strengthening, posture,      range of motion, etc.  I think this is paramount to her long-term      success as she has become significantly weak, deconditioning her      trunk musculature.  5. She can continue with Voltaren Gel, if she wishes, as well as      Lidoderm patches.  She can continue with Mobic as well as long as      it is okay with her GI team.  6. I will see her back in about a month to follow up progress.  She is      to call me with any problems.      Ranelle Oyster, M.D.  Electronically Signed     ZTS/MedQ  D:  04/01/2008 20:56:23  T:  04/03/2008 00:40:59  Job #:  [id number]   cc:   Corwin Levins, MD  [address]  [place]  [place] [address]

## 2010-11-16 NOTE — Assessment & Plan Note (Signed)
HISTORY OF PRESENT ILLNESS:  Kathy [name] is back regarding her multiple pain  issues and fibromyalgia.  She complains of hurting all over particularly  in the low back and in the neck and shoulder areas.  She has some  radiation of the neck pain to her head.  She saw a neurologist recently  who put her on more Amitriptyline.  She has had some problems with  vomiting for the last few weeks.  She is having very few bowel movements  overall.  When she does have a bowel movement, it is loose and of a  small amount.  Neurontin was started by [place] a month ago, although she  does not feel that this affected it, and she has been on her other  medications here for longer period.  She rates her pain today 8/10.  Describes it as sharp, stabbing, constant, tingling, and aching.  Pain  interferes with general activity, in relationship with others, and  enjoyment of life on a severe level.  Sleep is poor.  Pain increases  with walking, bending, sitting, and activity.  It improves somewhat with  medications and injections.  We discussed her injection course in the  past and she seems to have had the most benefit with the facet  blocks/medial branch blocks as opposed to the epidurals that were done  earlier in the winter.   REVIEW OF SYSTEMS:  Notable for the above as well as some numbness,  tremor, tingling, dizziness, depression, confusion, and anxiety.  Other  pertinent positives are above, and full 14-point review is in the  written health and history section.   SOCIAL HISTORY:  The patient is single, living with her mother and  brother.   PHYSICAL EXAMINATION:  VITAL SIGNS:  Blood pressure 157/98, pulse is 91,  respiratory rate 18, and she is sating 99% on room air.  GENERAL:  The patient remains overweight.  Affect is generally pleasant,  but a bit flat and quiet.  She has pain with palpation over the lower  lumbar segments, particularly L3 through L5 on the right more than left.  She also has pain  with flexion and then again with extension and facet  maneuvers today.  Rotation caused some discomfort as well.  MUSCULOSKELETAL:  Strength is 5/5 in both legs.  She has normal sensory  function essentially.  Reflexes are 1+ in both legs.  NECK:  Notable for pain along the course of the left trapezius muscle in  the upper cervical paraspinals.  With palpation, we were able to  reproduce her pain, and she had pain radiating into the head and temple  region as well with movement, particularly rotation and forward flexion  today.  NEUROLOGIC:  Stable for motor and sensory function in both upper  extremities today.  HEART:  Regular.  CHEST:  Clear.  ABDOMEN:  Soft and nontender.   ASSESSMENT:  1. Chronic lumbar spine and leg pain related to degenerative disk      disease and facet arthropathy.  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  Additionally, she      may have a component of myofascial pain in the lumbar spine and      additionally in the trapezius and cervical spinal muscles causing      pain.  2. Greater trochanteric bursitis.  3. Central pain syndrome/fibromyalgia.  4.  Multiple bowel surgeries with adhesions and now recurrent      constipation.  5. Obesity.  6. Hypothyroidism.   PLAN:  1. We reviewed the plan at length for bowel medications today.  She      needs to try a stool softener and laxative in addition to the      Amitiza that she is using.  She needs to try lactulose for more      severe symptoms, but may need to take it regularly for a while.      She may benefit from an occasional enema or suppository to help her      empty from below.  If she continues to have problems here, she      needs to discuss with her surgeon and GI physicians.  2. We refilled fentanyl patch today and oxycodone.  3. Continue with Neurontin for neuropathic symptoms.  4. After  informed consent, we injected essentially 6 separate areas      today as trigger points.  We injected the 2 spots in the right and      the left lumbar paraspinals approximately at L3 and L5.  Also, we      injected the left trapezius, the mid belly, as well as the upper      pole.  The patient tolerated it well and seemed to have some relief      before she left the office today.  We discussed further stretching      exercise, etc.  5. Pursue more workup and interventional treatments for her back pain,      pending GI symptoms.  She really needs to have her GI problems      situated first, as I think these are overall increasing her pain      due to persistent nausea, decreased quality of life, etc.  6. I will see her back in a month.      Ranelle Oyster, M.D.  Electronically Signed     ZTS/MedQ  D:  05/28/2008 11:05:17  T:  05/28/2008 22:17:35  Job #:  [id number]   cc:   Corwin Levins, MD  [address]  [place]  [place] [address]

## 2010-11-16 NOTE — Assessment & Plan Note (Signed)
HISTORY OF PRESENT ILLNESS:  Ms. Kathy [name] is back regarding her chronic  back pain and fibromyalgia.  We ordered an MRI back in June because of  symptoms of increased leg pain.  MRI was notable for some facet changes  and mild disk bulging.  She complains of pain again primarily the back  into the legs, both anterior and posteriorly.  She also has pain in her  chest as well as tingling, burning in her arms and neck.  The patient is  receiving biofeedback therapies [facility] for her GI dyssynergy.  She is trying to walk 15 minutes daily, but get tingling and pain in the  legs after a short period of time.  She complains of restless leg  symptoms, although bit better with ReQuip.  Sleep is in general still  has been poor.  She rates her pain in 9/10.  Described as sharp,  stabbing, tingling, and aching.  She complains of ongoing depression.  Her regimen of Abilify, Wellbutrin, Prozac does not seem to be helping  as she has ongoing fatigue associated with this.   REVIEW OF SYSTEMS:  Notable for multiple items which were all available  in the written health and history section of the chart.   SOCIAL HISTORY:  The patient is single, living with her mother and  brother still.  She is applying for disability.   PHYSICAL EXAMINATION:  VITAL SIGNS:  Blood pressure 145/86, pulse is 91,  respiratory rate 18, and she is sating 99% on room air.  GENERAL:  The patient is pleasant and moved a bit better, but overall  still flat.  She has multiple tender points really throughout the neck,  trunk, upper and lower extremity today.  NEUROLOGIC:  No focal motor deficits were seen or sensory problems.  Reflexes were 2+.  HEART:  Regular.  CHEST:  Clear.  ABDOMEN:  Soft, nontender.  EXTREMITIES:  Weight is generally stable.  She had pain with lumbar  movements and extension and flexion.  Provocative testing was equivocal  to negative in the lower extremity today.   ASSESSMENT:  1. Chronic low back  pain related to degenerative disk disease and      facet arthropathy.  2. History of cervical and lumbar myofascial pain.  3. Fibromyalgia.  4. Greater trochanter bursitis.  5. Depression and insomnia.  6. 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  7. Hypothyroidism.   PLAN:  1. We will increase breakthrough pain medicine from oxycodone 5 mg and      Percocet 7.5 one q.6 h p.r.n. #90.  We will stay with fentanyl      patch 50 mcg q.72 h.  2. We will wean Prozac down a 20 mg daily over 1 month and increase      Abilify to 5 mg nightly to improve sleep.  See if the Prozac change      perhaps helps her daytime energy.  Stay with Wellbutrin at current      dosing.  3. I would I would like physical therapy to work on extremity and      truncal range of motion, posture, strength, endurance as well as      pain relieving modalities and set the patient up for home exercise      program.  4. Increase vitamin B12 injections to 1000 units every week.  Need to      check B12 level over the next 2 months' time.  5. Consider another  anticonvulsant at some point.  6. Maintain ReQuip at 1 mg nightly for restless leg symptoms.  7. We will see her back in 1 month with nursing and 2 months with me.      Ranelle Oyster, M.D.  Electronically Signed     ZTS/MedQ  D:  02/18/2009 13:16:25  T:  02/19/2009 04:51:05  Job #:  [id number]   cc:   Corwin Levins, MD  [address]  [place]  [place] [address]

## 2010-11-16 NOTE — Procedures (Signed)
[name], [name] NO.:  [id number]   MEDICAL RECORD NO.:  [id number]         PATIENT TYPE:  HREC   LOCATION:                                 FACILITY:   PHYSICIAN:  Erick Colace, M.D.DATE OF BIRTH:  April 26, 1966   DATE OF PROCEDURE:  DATE OF DISCHARGE:                               OPERATIVE REPORT   Average pain is 7/10, pain right now is 9.  This is a left L5 dorsal  ramus radiofrequency neurotomy, left L4 medial branch radiofrequency  neurotomy, left L3 medial branch radiofrequency neurotomy under  fluoroscopic guidance.   There is intercurrent history of surgery for bowel obstruction on February 12, 2008.  She is not on any blood thinners.  She has had good results  with right-sided RF performed proximally 1 month ago on January 21, 2008.   Informed consent was obtained after describing risks and benefits of the  procedure with the patient.  These include bleeding, bruising, and  infection.  She elects to proceed and has given written consent.   The patient placed prone on fluoroscopy table.  Betadine prep, sterile  drape 25-gauge 1-1/2 inch needle was used to anesthetize the skin and  subcu tissue with 1% lidocaine x2 mL, then a 20-gauge 10-cm RF needle  with 10 mm curved active tip was inserted under fluoroscopic guidance  first targeting the left S1 SAP sacral ala junction.  Bone contact was  made and confirmed with lateral imaging.  Sensory stem at 50 Hz followed  by motor stem at 2 Hz confirmed proper needle location followed by  injection of dexamethasone lidocaine solution containing 1 mL of 4 mg/mL  dexamethasone and 2 mL of 1% MPF lidocaine followed by radiofrequency  lesioning at 70 degrees x70 seconds.  Because she had some burning type  discomfort in her back with 70 degrees we dial down at 60 degrees and  did this for 70 seconds at this level.  Then the left L5 SAP transverse  process junction targeted.  Bone contact made and confirmed  with lateral  imaging.  Sensory stem at 50 Hz followed by motor stem at 2 Hz confirmed  proper needle location followed by injection 1 mL of dexamethasone  lidocaine solution and radiofrequency lesioning at 70 degrees x70  seconds.  Then, the left L4 SAP transverse process junction targeted.  Bone contact made and confirmed with lateral imaging.  Sensory stem at  50 Hz followed by motor stem at 2 Hz confirmed proper needle location  followed by injection 1 mL of dexamethasone lidocaine solution and  radiofrequency lesioning 70 degrees Celsius for 70 seconds.  The patient  tolerated procedure well.  Pre- and post-injection vitals were stable.  Post injection instructions given.  She will follow up Dr. Riley Kill in 1  month.  She has noted she has some neck pain.  She may benefit from  further eval but this will be up to Dr. Riley Kill.  I also encouraged the  patient that she may reduce her pain medications now that she has had  both sides of her back  with radiofrequency lesioning.      Erick Colace, M.D.  Electronically Signed     AEK/MEDQ  D:  02/28/2008 14:20:38  T:  02/29/2008 05:00:55  Job:  [id number]

## 2010-11-16 NOTE — Procedures (Signed)
Kathy [name], Kathy [name] NO.:  [id number]   MEDICAL RECORD NO.:  [id number]         PATIENT TYPE:  HREC   LOCATION:                                 FACILITY:   PHYSICIAN:  Erick Colace, M.D.DATE OF BIRTH:  Sep 04, 1965   DATE OF PROCEDURE:  DATE OF DISCHARGE:                               OPERATIVE REPORT   PROCEDURE:  Right L5 dorsal ramus radiofrequency neurotomy, right L4  medial branch radiofrequency neurotomy, right L3 medial branch  radiofrequency neurotomy, and right L2 branch radiofrequency neurotomy.   INDICATION:  Lumbar spondylosis without myelopathy.  She has had relief  of greater than 50% x2 after medial branch blocks at corresponding  levels.  Pain interferes with activity and persists despite narcotic  analgesic medications and other conservative care.   Informed consent obtained after describing the risks and benefits of the  procedure with the patient, these include bleeding, bruising, and  infection, she elects to proceed and has given written consent.   DESCRIPTION OF PROCEDURE:  The patient placed prone on fluoroscopy  table.  Betadine prep and sterile drape.  A 25-gauge, 1-1/2-inch needle  was used to anesthetize the skin and subcutaneous tissue with 1%  lidocaine x2 mL.  Then, a 20-gauge 10-cm RF needle with 10-mm curved  active tip was inserted under fluoroscopic guidance, first starting at  the right S1 SAP and sacroiliac junction.  Bone contact made and  confirmed with lateral imaging.  Sensory stem at 50 Hz followed by motor  stem at 2 Hz confirmed proper needle location followed by injection of 1  mL and the solution containing 1 mL of 4 mg/mL dexamethasone and 4 mL of  1% MPF lidocaine followed by radiofrequency lesioning at 70 degrees  Celsius for 70 seconds.  Then, the right L5 SAP transverse process  junction was targeted.  Bone contact made and confirmed with lateral  imaging.  Sensory stem at 50 Hz followed by motor stem at  2 Hz confirmed  proper needle location followed by injection of 1 mL of dexamethasone-  lidocaine solution and RF lesioning at 70 degrees Celsius for 70  seconds.  Then, the right L4 SAP transverse process junction was  targeted.  Bone contact made and confirmed with lateral imaging.  Sensory stem at 50 Hz followed by motor stem at 2 Hz confirmed proper  needle location followed by injection of 1 mL of dexamethasone-lidocaine  solution and radiofrequency lesioning at 70 degrees Celsius x 70  seconds.  Then, the right L3 SAP transverse process junction was  targeted.  Bone contact made and confirmed with lateral imaging.  Sensory stem at 50 Hz followed by motor stem at 2 Hz confirmed proper  needle location followed by injection of 1 mL of the dexamethasone-  lidocaine solution followed by radiofrequency lesioning at 70 degrees  Celsius x 70 seconds.  The patient tolerated the procedure well.  Pre-  and post-injection vitals were stable.  Post-injection instructions were  given.  We will schedule for left-sided RF and corresponding levels in 1  month.  She has been trying to  go to work tomorrow, but if she has some  excessive postprocedure soreness, could ride her out for 1 day in  addition to being out today.  I have also written a prescription for  oxycodone 5 mg one p.o. q.8 h. p.r.n. #60 for a month supply.     Erick Colace, M.D.  Electronically Signed    AEK/MEDQ  D:  01/31/2008 10:34:41  T:  02/01/2008 00:19:16  Job:  [id number]

## 2010-11-16 NOTE — Assessment & Plan Note (Signed)
Kathy [name] is back regarding her chronic pain, particularly in the low back,  and leg pain.  Apparently, she is seeing neurology now for spots on her  brain that were worrisome apparently for MS plaques.  She had a lumbar  puncture about a week ago and is awaiting results on this.  She had good  results with a right greater trochanter injection and pain is just  starting to come back a bit.  She likes Lidoderm patches and the Mobic  does help a little bit for generalized back pain.  The back still causes  her pain at 6 to 7/10 and usually it starts above the right hip and  radiates up and down a bit from there.  It is usually worse when she is  up on her feet and active.  It will be difficult for her to sit for a  long time then stand as it becomes tight.  She has some relief when she  bends and flexes the back.  Sleep is poor.  She complains of some  blurred vision and decreased memory, as well as problems with occasional  spasms, particularly over the last week.  She recently has been started  on Topamax, Diovan, Reglan, and Phenergan.  She does not know that the  Ultram is helping her and questions whether she could come off of this.  Oxycodone does help for breakthrough pain.  She is still working as a  Lawyer.  She does have concerns about driving back and forth considering  some of the symptoms she describes above.  Apparently, she is seeing an  ophthalmologist today for eye examination.   REVIEW OF SYSTEMS:  Notable for the above, as well as loss of taste,  some weight gain, night sweats, high sugars at times, decreased  appetite, coughing, shortness of breath.   SOCIAL HISTORY:  Patient is single, living with her mother, and working  as above.   PHYSICAL EXAM:  Blood pressure is 112/76.  Pulse is 89.  Respiratory  rate 18.  She is satting 99% room air.  Patient is pleasant, alert and  oriented x3.  Affect is generally appropriate and bright.  She has  intact strength in all 4  extremities at 5/5.  Reflexes are 2+. There are  no obvious movement abnormalities today.  She did have pain with  palpation over the lumbar paraspinals and facets, particularly at the  right side.  She is able to bend and touch her toes without effort  today.  She had some pain with rotation and lateral bending to the  right.  Facet maneuvers were provocative on the right side today as  well.  Greater trochanter was minimally tender.  Straight leg testing  was equivocal, as was Luisa Hart test today.  HEART:  Regular.  CHEST:  Clear.  ABDOMEN:  Soft and nontender.   ASSESSMENT:  1. Chronic low back and leg pain.  I think a lot of her back symptoms      may be related to facet arthropathy.  Her MRI from January has      multiple findings but certainly facet arthropathy is one of them      and her exam seems most consistent with that today.  She had a poor      response to her prior L5-S1 injections.  2. Right greater trochanter bursitis status post injection with good      results.  3. Obesity.  4. Hypertension.  5. Recent decrease in  memory and vision, as well as tremors.  6. History of chronic fatigue syndrome.   PLAN:  1. We will set patient up for L3-S1 medial branch blocks on the right      per Dr. Wynn Banker.  2. Gave patient postural/Pilates exercises to work on at home.  3. Continue Mobic and Lidoderm patches for now as prescribed.  4. Hold Ultram ER, she does not know that this is benefitting her.      She will use oxycodone for breakthrough pain 5 mg every 8 hours      p.r.n.  5. Ask patient to follow up with her neurologist and GI specialist      regarding her recent complaints of memory and visual problems, as      well as tremor.  These very likely could be related to her      medications including Reglan and Topamax.  6. Continue heel insert for right foot.  7. I will see her back pending the injections above.      Ranelle Oyster, M.D.  Electronically  Signed     ZTS/MedQ  D:  11/12/2007 13:03:22  T:  11/12/2007 13:44:13  Job #:  [id number]   cc:   Corwin Levins, MD  [address]  [place]  [place] [address]

## 2010-11-16 NOTE — Assessment & Plan Note (Signed)
Kathy [name] is back regarding her multiple pain complaints.  Her biggest  complaint today is persistent low back pain and pain into her legs as  well as daytime fatigue.  Really all that was changed since I saw her  last in February Lunesta was added for sleep.  She is also using Savella  twice a day now, the second dose approximately at 6:00 p.m.  She is on  Amrix as well at night which she feels this helps somewhat with her  spasm.  She is no longer using Xanax or Valium by her account.  She saw  a specialist regarding her GI issues and some type of anorectal  dyssynergy was diagnosed.  I am not privy to her details.  She is going  to see her neurologist as well for abnormal MRI findings in the past.  The patient rates her pain at 8-9/10.  Pain is sharp burning, stabbing,  tingling, and aching.  Pain interferes with general activity, relations  with others, enjoyment of life on a moderate-to-severe level.  Sleep is  poor.   REVIEW OF SYSTEMS:  Notable for weakness, tingling, trouble walking,  spasms, dizziness, depression, anxiety, loss of taste, fever, chills,  nausea, abdominal pain, poor appetite, limb swelling.  Other pertinent  positives as above and full review is in the written health history  section of the chart.   SOCIAL HISTORY:  The patient is single, living with her mother and  brother.   PHYSICAL EXAMINATION:  VITAL SIGNS:  Blood pressure is 154/93, pulse is  106, respiratory rate 18, she is sating 99% on room air.  GENERAL:  The patient is generally pleasant and bit flat.  She has  diffuse pain throughout the neck, low back, leg areas.  No focal motor  deficits were seen.  Reflexes were 2+.  Attention and focus were fairly  good.  HEART:  Tachycardic.  CHEST:  Clear.  ABDOMEN:  Soft, nontender.  BACK:  Slightly more tender with extension and flexion today but had  generalized pain throughout range of motion.  Straight leg testing was  equivocal.  Lateral bending and  rotating on either side cause some  discomfort as well today.   ASSESSMENT:  1. Chronic low back pain associated with degenerative disk disease and      facet arthropathy.  The patient has not had any imaging of her low      back for some time now.  2. Cervicolumbar and lumbar myofascial pain.  3. Fibromyalgia.  4. Greater trochanteric bursitis.  5. 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  6. Mild obesity.  7. Hypothyroidism.  8. Insomnia.   PLAN:  1. We will continue with fentanyl patch 50 mcg q.72 h.  She will use      her oxycodone 5 mg one q.6 h. p.r.n. for breakthrough pain, #90.  2. We will drop the evening Savella and stay with the a.m. Savella 50      mg.  3. Drop Lunesta and begin a trial of ReQuip 0.25-0.5 mg nightly,      increasing to 1 mg if indicated.  4. The Amrix for muscle spasm and sleep at night.  5. Encouraged dietary changes and activity increase.  6. Await her workups as noted above.  7. I will see her back in about 2 months with nursing followup next      month.      Ranelle Oyster, M.D.  Electronically Signed  ZTS/MedQ  D:  12/05/2008 13:06:46  T:  12/06/2008 05:10:52  Job #:  [id number]   cc:   Corwin Levins, MD  [address]  [place]  [place] [address]

## 2010-11-16 NOTE — Assessment & Plan Note (Signed)
[facility]                         GASTROENTEROLOGY OFFICE NOTE   KEMONI, QUESENBERRY                       MRN:          [id number]  DATE:03/15/2007                            DOB:          [birth year]    Ms. Wieseler complains of right flank pain that radiates to her back,  chest, and arms.  Her symptoms are clearly exacerbated by movement and  bending.  They are also occasionally associated with meals.  She has  abdominal bloating with mild generalized abdominal discomfort and back  pain as well.  Please see the evaluation from April 2008, on the chart.  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  She notes no weight loss, melena,  hematochezia, dysphagia, or odynophagia.  She does note headaches,  nausea, and occasional choking intermittently with liquids and solids.  She has previously been evaluated for dysphagia, odynophagia, and  hoarseness, and has had an extensive GI and ENT evaluation that did not  reveal any evidence of GERD.  I was concerned that some of her symptoms  may be musculoskeletal or functional in nature.   CURRENT MEDICATIONS:  Listed on the chart, updated and reviewed.   MEDICATION ALLERGIES:  1. LATEX.  2. AMOXICILLIN.   PHYSICAL EXAMINATION:  GENERAL:  No acute distress.  VITAL SIGNS:  Weight 176.4 pounds, blood pressure is 146/90, pulse 88  and regular.  HEENT:  Anicteric sclerae.  Oropharynx clear.  CHEST:  Clear to auscultation bilaterally.  BACK:  With some mild mid and lower spinal tenderness.  No CVA  tenderness.  ABDOMEN:  Soft, well healed incision, some tenderness at the incision,  and some mild generalized tenderness to deep palpation with no rebound  or guarding.  No palpable organomegaly, masses, or hernias.  Normoactive  bowel sounds.  RECTAL:  Deferred to time of colonoscopy.  NEUROLOGIC:  Alert and oriented x3.  Grossly nonfocal.   ASSESSMENT/PLAN:  Right flank pain exacerbated by movement and meals.  Generalized abdominal bloating and discomfort.  Back pain.  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  Brother with colon polyps in his early  60s.  I suspect her symptoms are multifactorial and not primarily  gastrointestinal.  I have asked her to avoid all milk products and to  minimize fatty food intake.  Risks, benefits, and alternatives to  colonoscopy with possible biopsy and possible polypectomy  discussed with the patient and she consents to proceed.  This will be  scheduled electively.  She is advised to return to her primary MD for  additional evaluation.     [name], MD, [facility]  Electronically Signed    MTS/MedQ  DD: 03/19/2007  DT: 03/19/2007  Job #: [id number]   cc:   Lois Huxley, Dr.

## 2010-11-16 NOTE — Assessment & Plan Note (Signed)
HISTORY OF PRESENT ILLNESS:  Kathy [name] is back regarding her back pain and  fibromyalgia.  She was hospitalized once again in early December for GI  obstruction.  She is off her Mobic now.  She lost her job and insurance  now due to her multiple medical issues.  She has been able to pay for  her fentanyl patch due to financial constraints, but states that her  uncle may help her out.  She rates her pain at 9-10/10, describes as  sharp, stabbing, and aching.  Oswestry score is 44%.  Pain interferes  with general activity, in relations with others, enjoyment of life on a  moderate-to-severe level.  Sleep is poor.  She is able to take some of  her Percocet for breakthrough symptoms.   REVIEW OF SYSTEMS:  Notable for multiple issues including bowel and  bladder issues as above, tingling, numbness, trouble walking, dizziness,  depression, anxiety, abdominal pian, poor appetite, nausea, vomiting,  sweats, weight gain, fever.  Full review is written out in the history  section of the chart.   SOCIAL HISTORY:  The patient is single, living with her mother and  brother currently.   PHYSICAL EXAMINATION:  VITAL SIGNS:  Blood pressure is 154/87, pulse is  90, respiratory rate is 18.  She is satting 99% on room air.  GENERAL:  The patient is pleasant, alert, and oriented x3.  Affect is  extremely appropriate.  She is limited with range of motion at the back and legs today due to  pain.  Strength is generally 5/5.  Normal sensory function.  Reflexes  are 1+.  She had less pain around the neck during the last visit.  Low  back was tender to palpation with notable trigger points.  Neurologically, she was intact with good cranial nerve function.  Cognitively, she was normal.  HEART:  Regular.  CHEST:  Clear.  ABDOMEN:  Soft and nontender.   ASSESSMENT:  1. Chronic lumbar spine and leg pain related to degenerative disk      disease and facet arthropathy.  2. Cervical and lumbar myofascial pain.  3.  Greater trochanteric bursitis.  4. Centralized pain syndrome.  5. Multiple bowel surgeries and adhesions.  6. Obesity.  7. Hypothyroidism.   PLAN:  1. I refilled fentanyl patch as she stated that she will attempt to      get this filled.  It was helping her quite a bit.  2. I refilled oxycodone 5 mg 1 q.8 h. p.r.n., #60.  3. I think, the patient may need to look at going on disability until      some of her issues are resolved from a GI and      pain standpoint.  We also will be looking at other vocational      options as well at that point if she is ready.  4. We will see her back in 2 months.  She will pick up prescriptions      in 1 month's time.      Ranelle Oyster, M.D.  Electronically Signed     ZTS/MedQ  D:  06/30/2008 14:36:56  T:  07/01/2008 08:29:36  Job #:  [id number]   cc:   Corwin Levins, MD  [address]  [place]  [place] [address]

## 2010-11-16 NOTE — Assessment & Plan Note (Signed)
Kathy [name] is back regarding her multiple pain issues related to her low back  and potential fibromyalgia syndrome.  She continues to have pain more  diffuse in nature of her both legs now, particularly over the anterior  lateral aspects in addition to the posterior legs.  She has been using  the fentanyl patch and feels that these have helped somewhat.  She has  been placing moov over her spine, however.  She is off the Neurontin  apparently per her family physician.  She has not noticed any specific  change with the Savella.  She does note that her hair is falling out.  Bowel movements have been a bit better with the Amitiza.  She has been  doing physical therapy and does not feel that it has helped a great deal  from a pain standpoint, although has helped some with range of motion.  Pain interferes with general activity, in relationship with others, and  enjoyment of life on a moderate-to-severe level.  Sleep is poor.   REVIEW OF SYSTEMS:  Notable for multiple items.  She nearly checked off  every symptom on my review of systems.  Please see health and history  section.   SOCIAL HISTORY:  The patient is single.  Living with her mother.  Mother  is with her today.   PHYSICAL EXAMINATION:  VITAL SIGNS:  Blood pressure is 151/90, pulse is  82, respiratory rate 16, and she is sating 96% on room air.  GENERAL:  The patient is pleasant, alert, and oriented x3.  Affect is  bright and appropriate.  She is a bit quiet.  EXTREMITIES:  She had pain somewhat with flexion more than extension  today in the low back.  The low back was somewhat tender to palpation.  Her fentanyl patches on the spine at the L4 level was wrinkled and  folded up.  Leg strength is near 5/5 with some subjective sensory loss  distally.  No focal or sensory loss was seen proximally in the back.  She remains overweight.  NEUROLOGIC:  She is alert and appropriate.  Cranial nerve exam is  intact.  HEART:  Regular.  CHEST:   Clear.  ABDOMEN:  Soft and nontender.  She remains overweight.   ASSESSMENT:  1. Chronic lumbar spine and leg pain related to lumbar degenerative      disk disease and facet arthropathy.  2. Right greater trochanter bursitis.  3. Obesity.  4. Multiple bowel surgeries due to adhesions.  5. Hypertension.  6. Likely fibromyalgia/central pain syndrome.  7. Hypothyroidism.   PLAN:  1. Due to the patient's persistent symptoms and complaints of hair      loss, I think it is worthwhile to check thyroid function panel as      well as hormone levels today.  2. We discussed appropriate application of her fentanyl patch.  She      surely has not been receiving consistent amount of the medication      due to her application site and technique, etc.  She may use a      Lidoderm patch locally for back symptoms.  3. Resume Neurontin 300 mg t.i.d.  This may help some of her      paresthesias.  I wonder if some of her leg symptoms are due to      meralgia paresthetica.  4. Refilled oxycodone 5 mg 1 q.8 h p.r.n., #60.  5. Encourage ongoing therapy to work on range of motion stretching  physical endurance.  I am not expecting      therapy to certainly decrease her pain levels.  6. I will see her back in a about a month's time.      Ranelle Oyster, M.D.  Electronically Signed     ZTS/MedQ  D:  04/29/2008 11:58:33  T:  04/30/2008 01:05:12  Job #:  [id number]   cc:   Corwin Levins, MD  [address]  [place]  [place] [address]

## 2010-11-16 NOTE — Procedures (Signed)
NAMEMACRINA, [name] NO.:  [id number]   MEDICAL RECORD NO.:  [id number]          PATIENT TYPE:  REC   LOCATION:  [facility]                          FACILITY:  [facility]   PHYSICIAN:  Erick Colace, M.D.DATE OF BIRTH:  Apr 22, 1966   DATE OF PROCEDURE:  DATE OF DISCHARGE:                               OPERATIVE REPORT   PROCEDURES:  1. Right L5 dorsal ramus injection.  2. Right L4 medial branch block.  3. Right L3 medial branch block.  4. Right L2 medial branch block.   To block the innervation of the right L5-S1, L4-5, and L3-4 facet joint  complexes.   INDICATION:  Lumbar spondylosis without myelopathy with primarily axial  back pain.  Pain is only partially responsive to medication management,  interferes with ADLs and mobility.   Informed consent was obtained after describing risks and benefits of the  procedure with the patient.  This includes bleeding, bruising,  infection, temporary or permanent paralysis.  She elects to proceed and  has given written consent.   The patient was placed prone on fluoroscopy table.  Betadine prep and  sterile drape.  A 25-gauge 1-1/2-inch needle was used to anesthetize the  skin and subcutaneous tissue with 1% lidocaine x2 mL at each of 4 sites  and a 22-gauge 3-1/2-inch spinal needle was inserted under fluoroscopic  guidance first starting at right S1 SAP and sacroiliac junction.  Bone  contact was made and confirmed with lateral imaging.  Omnipaque 180 x  0.5 mL demonstrated no intravascular uptake, then 0.5 mL of  dexamethasone-lidocaine solution was injected.  Then the right L5 SAP  transverse process junction was targeted.  Bone contact made.  Omnipaque  180 x 0.5 mL demonstrated no intravascular uptake, then 0.5 mL of  dexamethasone-lidocaine solution was injected.  Then the right L4 SAP  transverse process junction was targeted.  Bone contact made.  Omnipaque  180 x 0.5 mL under live fluoro demonstrated no  intravascular uptake,  then 0.5 mL of dexamethasone-lidocaine solution was injected.  Then the  right L3 SAP transverse process junction was targeted.  Bone contact  made and confirmed with lateral imaging.  Omnipaque 180 x 0.5 mL under  live fluoro demonstrated no intravascular uptake, then 0.5 mL of  dexamethasone-lidocaine solution was injected.  The patient tolerated  the procedure well.  Pre- and post-injection vitals were stable.  Dexamethasone-lidocaine solution consists of 1 mL of 4 mg/mL  dexamethasone and 2 mL of 2% MPF lidocaine.  Pre-injection pain level  7.5, post-injection pain level 5.  We will give pain diary and attention  to repeat should she  reach the 50% reduction threshold.  We will tentatively schedule her  back for 3-4 weeks for repeat and consider sacroiliac injection if this  is not particularly helpful.      Erick Colace, M.D.  Electronically Signed     AEK/MEDQ  D:  12/13/2007 08:59:30  T:  12/13/2007 21:36:34  Job:  [id number]

## 2010-11-16 NOTE — Assessment & Plan Note (Signed)
Kathy [name] is back regarding her multiple pain problems.  She continues to  have problems with pain, and she feels that is progressing.  She still  has no resolution regarding her abdomen and apparently is going to [facility] for another opinion.  She is having a hard time sleeping.  She has, otherwise, spasms at nighttime.   She is on multiple medications for her mood, now including,  1. Abilify 2 mg nightly.  2. Savella 50 mg b.i.d., the second dose which she takes at bedtime.  3. Wellbutrin XL 300 mg daily.  4. Xanax 1 mg 2 times a day p.r.n.  5. Diazepam 10 mg 2 times a day which she has not filled yet.  6. She is on fentanyl patch 25 mcg q.72 h. as well as oxycodone 5 mg      q.8 h. p.r.n.   Some of her other meds she had to stop taking because she has no  insurance coverage.  The patient rates her pain 8-10/10.  Her Oswestry  score is 66% today.  Pain is sharp, burning, stabbing, constant,  tingling, aching.  Pain interferes with general activity, relations with  others, enjoyment of life on a moderate-to-severe level.  Pain is  diffuse to the arms, chest, abdomen, back, legs.  Sleep is poor as noted  above.  She can walk to short distances.  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   Review of systems is essentially completely filled out today.  She does  have a history of some suicidal thoughts but not actively, and she has  not expressed ideations of following through on any of these.  Full  review is in the written health history section of the chart.   SOCIAL HISTORY:  The patient is divorced, living with her mother and  brother currently.   PHYSICAL EXAMINATION:  Blood pressure is 154/96, pulse is 86,  respiratory rate 18.  She is sating 100% on room air.  The patient is  pleasant, alert and oriented x3.  Her affect is a bit flat.  She  continues to have pain throughout with palpation and range in the arms,  trunk, neck, legs, etc, today.  Abdomen was not  examined and this is  very tender as well.  Cognitively, she is appropriate.  She has good  insight awareness.  She does have some problems with memory and  attention at times.  Heart is regular.  Chest is clear.  Abdomen is  notable for positive bowel sounds.   ASSESSMENT:  1. Chronic lumbar spine pain and leg pain related to degenerative disk      disease and facet arthropathy.  2. Cervical lumbar myofascial pain.  3. Greater trochanter bursitis.  4. Fibromyalgia.  5. 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  6. Obesity.  7. Hypothyroidism.   PLAN:  1. Continue fentanyl patch, but increase to 50 mcg q.72 h.  I warned      her of constipation risk.  She will need to be aggressive along      these lines.  2. Refill oxycodone 5 mg #60.  3. Encouraged the patient again to seek disability at this point, as      her multiple problems have really sent her back at this time, and      she no longer has any type of medical coverage.  I do not think  vocational pursuits at this point are appropriate and are likely a      long term at best.  4. Encouraged her to take her second Savella before dinner time.  This      may be interfering with sleep by the fact that she is taking it at      bedtime.  5. We gave the patient a trial of Amrix 15 mg nightly for sleep and      muscle spasms.  I discouraged the use of      Valium and Xanax, certainly together.  6. We will see her back in 2 months with me in 1 month for nursing      followup.      Ranelle Oyster, M.D.  Electronically Signed     ZTS/MedQ  D:  08/29/2008 13:23:55  T:  08/30/2008 01:24:49  Job #:  [id number]   cc:   Corwin Levins, MD  [address]  [place]  [place] [address]

## 2010-11-17 ENCOUNTER — Encounter: Payer: Medicare Other | Attending: Physical Medicine & Rehabilitation | Admitting: Neurosurgery

## 2010-11-17 DIAGNOSIS — M129 Arthropathy, unspecified: Secondary | ICD-10-CM | POA: Insufficient documentation

## 2010-11-17 DIAGNOSIS — M722 Plantar fascial fibromatosis: Secondary | ICD-10-CM | POA: Insufficient documentation

## 2010-11-17 DIAGNOSIS — G8929 Other chronic pain: Secondary | ICD-10-CM | POA: Insufficient documentation

## 2010-11-17 DIAGNOSIS — M79609 Pain in unspecified limb: Secondary | ICD-10-CM | POA: Insufficient documentation

## 2010-11-17 DIAGNOSIS — IMO0001 Reserved for inherently not codable concepts without codable children: Secondary | ICD-10-CM | POA: Insufficient documentation

## 2010-11-17 DIAGNOSIS — M545 Low back pain, unspecified: Secondary | ICD-10-CM | POA: Insufficient documentation

## 2010-11-17 DIAGNOSIS — M7660 Achilles tendinitis, unspecified leg: Secondary | ICD-10-CM | POA: Insufficient documentation

## 2010-11-18 NOTE — Assessment & Plan Note (Signed)
HISTORY OF PRESENT ILLNESS:  Ms. Kathy [name] is a patient of Dr. Riley Kill.  She is back with a chronic low back pain and fibromyalgia.  She does talk to me about her problems with history of bowel blockages and she has had some difficulties for the most.  Part right now, she is doing okay.  She rates her pain at about 9 that is sharp burning, stabbing, and aching. General activity level is 9 or 10.  Sleep patterns are poor.  Pain is worse during the daytime and at night.  All activities aggravate her pain.  Rest, medications, and injections tend to help.  She walks without assistance.  She can climb steps and drive.  She can walk about 15 minutes at a time.  She is disabled.  REVIEW OF SYSTEMS:  Notable for those difficulties as above with fevers, chills, night sweats or weight gain or weight loss some skin breakdown, nausea, vomiting, constipation, abdominal pain, poor appetite.  She does have some numbness and tingling from time to times.  Confusion, anxiety, depression, otherwise within normal limits.  PAST MEDICAL HISTORY:  Unchanged.  SOCIAL HISTORY:  Unchanged.  PHYSICAL EXAMINATION:  VITAL SIGNS:  Blood pressure to 154/89, pulse 111, respirations are 20, and O2 sats 98 on room air. MUSCULOSKELETAL:  Her lower extremity strength is 5/5.  Her sensation is positive and equal in the upper and lower extremities. NEUROLOGICAL:  Her affect is bright and alert.  She is oriented x3. Constitutionally, she is within normal limits.  ASSESSMENT: 1. Chronic low back pain. 2. Facet arthropathy. 3. Fibromyalgia. 4. History of right Achilles tendinopathy. 5. 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 6. History of cystitis.  PLAN: 1. She will continue her home exercise program. 2. We refilled her Opana ER 10 mg one every 12 hours 60 with no     refill. 3. Oxycodone one every 8 hours as needed 90 with no refill. 4. We will see her back here in a month for nurse visit.  Her  questions were encouraged and answered.  She is in agreement with     this plan.     Kathy [name] Kathy [name] Electronically Signed    RLW/MedQ D:  11/17/2010 14:15:36  T:  11/18/2010 01:18:41  Job #:  [id number]

## 2010-11-19 NOTE — Op Note (Signed)
NAMEJAMEEKA, MARCY                ACCOUNT NO.:  [id number]   MEDICAL RECORD NO.:  [id number]          PATIENT TYPE:  AMB   LOCATION:  ENDO                         FACILITY:  [facility]   PHYSICIAN:  Malcolm T. Russella Dar, MD, FACGDATE OF BIRTH:  [birth year]   DATE OF PROCEDURE:  02/01/2006  DATE OF DISCHARGE:  02/01/2006                                 OPERATIVE REPORT   SURGEON:  Judie Petit T. Russella Dar, MD, [facility].   PROCEDURE:  Esophageal manometry.   DESCRIPTION OF PROCEDURE:  The probe was passed without difficulty through  the left nostril.  The patient tolerated the procedure well.   RESULTS:  1. Upper esophageal sphincter demonstrated normal relaxation and normal      pressures.  2. Esophageal body:  Normal peristalsis and normal pressures.  In the      lower esophageal body, 10 out of 10 swallows were peristaltic.  3. Lower esophageal sphincter registered a low-normal resting pressure at      10.2 mmHg; 85% relaxation was noted, which is normal.  The lower      esophageal sphincter measured 6 cm, with the proximal end at 36 and the      distal end at 42.   IMPRESSION:  Normal esophageal manometry.  Low-normal lower esophageal  resting pressure noted.   RECOMMENDATIONS:  Proceeded with ambulatory pH study.      [name], MD, [facility]  Electronically Signed     MTS/MEDQ  D:  02/17/2006  T:  02/17/2006  Job:  [id number]

## 2010-11-19 NOTE — Discharge Summary (Signed)
[name], [name] NO.:  [id number]   MEDICAL RECORD NO.:  [id number]          PATIENT TYPE:  INP   LOCATION:  1606                         FACILITY:  [facility]   PHYSICIAN:  Anselm Pancoast. Weatherly, M.D.DATE OF BIRTH:  [birth year]   DATE OF ADMISSION:  04/18/2006  DATE OF DISCHARGE:  04/23/2006                                 DISCHARGE SUMMARY   DISCHARGE DIAGNOSIS:  Tubovillous adenoma, hepatic flexure of colon.   OPERATIONS:  Right colectomy.   HISTORY:  Kathy [name] is a 45 year old black female who was referred to me  by Claudette Head, after she was referred to him by Dr. Louanna Raw after he  found guaiac positive stools on a routine physical examination.  She had a  colonoscopy and found to have a large tubulovillous adenoma and hepatic  flexure of her colon that was partially removed.  There was no frank cancer  within it, but it was large enough that Dr. Russella Dar thought that she would be  best treated by a right colectomy.  I saw her in the office, and she stated  that at no time had she ever noticed any blood in her stools.  Family  history is not positive for colon cancer or polyps.  Her pathology did not  show that this looked like it was a cancer.  She is otherwise in good health  with the exception that she is on Synthroid 50 mcg a day for a slightly  enlarged thyroid, and the only previous surgery was a partial hysterectomy  done in 2004, benign disease.  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  On physical examination, she  is 5 feet, 1 inches, weight 171 pounds, blood pressure normal.  She had a  GoLYTELY erythromycin neomycin bowel prep in preparation for her surgery and  presented here on the morning April 18, 2006, for her surgery.  She was  taken to surgery, and through a right transverse incision, a right colectomy  was performed.  Dr. Jerelene Redden assisted, and there was no obvious  evidence of  any spread or lymphadenopathy.  The right colon was removed  including 24 lymph nodes.  All of these lymph nodes were negative for  metastatic disease, and the tumor was described as a 4 cm tubulovillous  adenoma with no high grade dysplasia or __________cancer noted.  She did  well following her surgery, no NG tube.  Started passing gas, and on about  the second postoperative day had PCA morphine for pain control.  Was given a  dose of unison preoperatively and had PAS stockings.  By postoperative day  #2, she was started on liquids.  This was gradually advanced over the next  48 hours, and she was ready for discharge on postoperative day #5.  Her path  report was informed that it was benign.  She was relieved and her incision  appeared to be healing nicely and her discharge.  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  She has Vicodin for incisional pain, and her hematocrit was 33  preoperatively, the same postoperatively.           ______________________________  Anselm Pancoast. Zachery Dakins, M.D.     WJW/MEDQ  D:  05/31/2006  T:  05/31/2006  Job:  [id number]   cc:   Louanna Raw  Fax: [phone]   [name], MD, FACG  [address]  [place]  [place] [address]

## 2010-11-19 NOTE — Assessment & Plan Note (Signed)
[facility]                           GASTROENTEROLOGY OFFICE NOTE   SHYVONNE, CHASTANG                       MRN:          [id number]  DATE:05/04/2006                            DOB:          [birth year]    Mrs. Fleer returns status post right hemicolectomy by Dr. Zachery Dakins.  A  mild anemia was noted on a recent CBC with a hemoglobin of 11.8 and a normal  MCV at 90.8.  She is recovering well from her right hemicolectomy.  The  pathology revealed a tubulovillous adenopathy with no evidence of high-grade  dysplasia or invasive carcinoma.  Twenty-four lymph nodes were negative.  The appendix was benign.  She had several questions about her post-surgical  recovery and I have advised her to contact Dr. Annette Stable office for  further advice.  She relates that she has a brother who developed polyps in  his early 80s.   CURRENT MEDICATIONS:  Listed on the chart-updated and reviewed.   MEDICATION ALLERGIES:  LATEX.   PHYSICAL EXAM:  Weight 165.8 pounds.  Blood pressure is 120/86, pulse 64 and  regular.  CHEST:  Clear to auscultation bilaterally.  CARDIAC:  Regular rate and rhythm without murmurs appreciated.  ABDOMEN:  Soft and nontender with normoactive bowel sounds.  The wound is  healing well.   ASSESSMENT AND PLAN:  1. 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  The patient was referred to Surgery      initially for removal of this lesion because it could not be safely      removed endoscopically.  Given the size of the lesion, there was      concern that the lesion might prove to be malignant, but fortunately it      was not.  Plan for a recall colonoscopy in October of 2008 and then      likely every 2 or 3 years thereafter.  2. Normocytic anemia.  Repeat complete blood count in December 2007.     [name], MD, [facility]  Electronically Signed    MTS/MedQ  DD: 05/09/2006  DT:  05/09/2006  Job #: [id number]   cc:   Anselm Pancoast. Zachery Dakins, M.D.

## 2010-11-19 NOTE — Assessment & Plan Note (Signed)
[facility]                         GASTROENTEROLOGY OFFICE NOTE   Kathy, [name]                       MRN:          [id number]  DATE:11/01/2006                            DOB:          04-Sep-1965    Kathy [name] complains of back pain and epigastric pain.  Occaisionally  the pain seems to radiate straight through to her back.  At other times  the pain radiates around her right flank to her abdomen.  She states she  fell on her right side about 2 months ago before these symptoms started.  She has noted occasional episodes of mild diarrhea and mild  constipation, but the variation in bowel habits does not appear to be  related to her digestive complaints.  She notes that her pain is often  worse when she lies flat.  Her pain is not worsened or improved by  meals.  Her appetite is good, and her weight is stable.  She was seen by  her primary physician, Dr. Lois Huxley, and referred to me for further  evaluation.  She is status post a right hemicolectomy for a  tubulovillous adenoma of the hepatic flexure in October 2007.  She notes  no melena, hematochezia, nausea, vomiting, dysphagia, odynophagia, or  weight loss.   CURRENT MEDICATIONS:  1. Levothroid 75 mcg daily.  2. Vitamin D weekly.  3. Caltrate D twice a day.  4. Magnesium daily.  5. Tylenol 3 p.r.n.  6. Over-the-counter laxative p.r.n.   MEDICATION ALLERGIES:  LATEX.   EXAMINATION:  In no acute distress.  Weight 169 pounds, up 3 pounds since her visit in November 2007.  Blood  pressure 132/84.  Pulse 100 and regular.  HEENT EXAM:  Anicteric sclerae.  Oropharynx clear.  CHEST:  Clear to auscultation bilaterally.  BACK:  With some mid and lower spinal tenderness.  No CVA tenderness.  ABDOMEN:  Soft with a well-healed incision.  She has some tenderness at  the incision, and just superior to it.  No rebound or guarding.  No  palpable organomegaly, masses, or hernias.  Normoactive bowel  sounds.  NEUROLOGIC:  Alert and oriented x3.  Grossly nonfocal.   ASSESSMENT AND PLAN:  1. Back pain and epigastric pain. Rule out musculoskeletal disorders,      adhesions, gastrointestinal disorders and other causes.  We will      obtain blood work today, and schedule a CT scan of the abdomen and      pelvis.  If no findings are noted, consider an abdominal ultrasound      to definitively exclude cholelithiasis.  If the above evaluation is      unremarkable, she will return to her primary care physician for      further evaluation of nongastrointestinal causes.  2. Personal history of a tubulovillous adenoma, status post right      hemicolectomy in November      2007.  A brother with colon polyps in his early 76s.  Plan for      followup colonoscopy October 2008.     [name], MD, [facility]  Electronically  Signed    MTS/MedQ  DD: 11/01/2006  DT: 11/01/2006  Job #: [id number]   cc:   Lois Huxley, MD

## 2010-11-19 NOTE — Assessment & Plan Note (Signed)
[facility]                           GASTROENTEROLOGY OFFICE NOTE   Kathy [name], Kathy [name]                       MRN:          [id number]  DATE:01/18/2006                            DOB:          1965/08/07    REFERRING PHYSICIAN:  Suzanna Obey, MD   REASON FOR REFERRAL:  Odynophagia and neck pain.   HISTORY OF PRESENT ILLNESS:  Kathy [name] is a 45 year old African American  female, followed by Dr. Lois Huxley at [facility].  She was  recently seen by Dr. Suzanna Obey at [facility].  She relates about a 6-  month history of recurrent sore throat with significant neck pain, and pain  on swallowing that she localizes to her mid neck and the base of her neck.  The odynophagia is more prominent on the left, and tends to radiate to the  left ear.  The odynophagia is present with liquids and solids.  She notes no  chest discomfort, and she does not localize any of her symptoms to her chest  or abdomen.  She was started on Aciphex at 20 mg q. day by her primary  physician, with no improvement in symptoms.  She saw Dr. Jearld Fenton on June 6,  and had findings of interarytenoid edema and vocal cord thickening.  A CT  scan of the neck was normal.  A barium esophagram showed mild  gastroesophageal reflux disease, and no delay in the barium tablet.  Mild  tertiary contractions were noted.  She was placed on Nexium 40 mg b.i.d.,  and Aciphex was discontinued.  She has noted possibly a slight improvement  in her symptoms, but no substantial improvement has been noted.  She has no  heartburn, regurgitation, nausea or vomiting.  She does have some mild mid  abdominal and lower abdominal pain that is related to bloating and  constipation, and does not appear to be related to swallowing.  She relates  a decrease in appetite.  However, she has had a 30-pound weight gain over  the past year.   PAST MEDICAL HISTORY:  1.  Status post vaginal hysterectomy for  fibroids in 2000.  2.  [facility] spotted fever 2003.  3.  Possible chronic fatigue syndrome, diagnosed in 2003.   CURRENT MEDICATIONS:  1.  Nexium 40 mg p.o. b.i.d.  2.  Allegra 180 mg daily.  3.  Centrum multivitamin daily.   MEDICATION ALLERGIES:  LATEX ALLERGY WITH RASH, LOCAL SWELLING, SHORTNESS OF  BREATH.   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  She denies tobacco and alcohol product usage.   REVIEW OF SYSTEMS:  Multiple areas positive.  Please see the handwritten  form.   PHYSICAL EXAMINATION:  Overweight, in no acute distress.  Height 5 feet 1 inch, weight 172 pounds.  Blood pressure is 124/80, pulse 70  and regular.  HEENT:  Anicteric sclerae.  Oropharynx clear.  NECK:  Without thyromegaly, adenopathy or tenderness appreciated.  CHEST:  Clear to auscultation bilaterally.  CARDIAC:  Regular rate and rhythm without murmurs appreciated.  ABDOMEN:  Soft, nontender, nondistended, normal active bowel sounds.  No  palpable organomegaly, masses or hernias.  RECTAL:  Examination deferred.  EXTREMITIES:  Without clubbing, cyanosis or edema.  NEUROLOGIC:  Alert and oriented x3.  Grossly nonfocal.   ASSESSMENT AND PLAN:  Neck pain with odynophagia, etiology unclear.  These  symptoms would certainly be atypical for gastroesophageal reflux disease.  Infectious esophagitis and additional ENT disorders need to be excluded.  Proceed with upper endoscopy with possible biopsy and possible dilation for  further evaluation.  Continue Nexium 40 mg p.o. b.i.d. for now, along with  standard antireflux measures, although  the diagnosis of reflux is certainly  in question at this time.  If a diagnosis is not established at upper  endoscopy, we will proceed on to a 24-hour dual-probe ambulatory pH study,  performed off all acid suppressants.                                   Kathy Lick. Pleas Koch., MD, [facility]   MTS/MedQ  DD:  01/18/2006  DT:   01/19/2006  Job #:  [id number]   cc:   Dr. Lois Huxley

## 2010-11-19 NOTE — Op Note (Signed)
Kathy [name], Kathy [name]                ACCOUNT NO.:  [id number]   MEDICAL RECORD NO.:  [id number]          PATIENT TYPE:  AMB   LOCATION:  ENDO                         FACILITY:  [facility]   PHYSICIAN:  Malcolm T. Russella Dar, MD, FACGDATE OF BIRTH:  [birth year]   DATE OF PROCEDURE:  02/01/2006  DATE OF DISCHARGE:  02/01/2006                                 OPERATIVE REPORT   SURGEON:  Judie Petit T. Russella Dar, MD, [facility].   PROCEDURE:  24-hour dual probe ambulatory pH study.   INDICATIONS:  A 45 year old female with persistent belching.  Rule out GERD.  The patient was off all acid suppressants for 5 days prior to the study.  She tolerated the procedure well.   RESULTS:  1. Proximal channel with 0.1%  of the time below pH of 4.  Six reflux      episodes were noted.  No correlation with belching or meals was noted      on the symptom index analysis.  2. Distal channel 0.6% of the time was below pH of 4.  Nine reflux      episodes were noted.  The longest reflux episode was 1 minute.  Symptom      index correlation with meals was 25%, and with belching was 33.3%.      This is not indicative of an adequate correlation.  The DeMeester score      for the distal esophageal channel was 1.7, with normal being less than      22.   IMPRESSION:  1. No evidence of gastroesophageal reflux disease.  2. Discontinue acid suppressants.      [name], MD, [facility]  Electronically Signed     MTS/MEDQ  D:  02/17/2006  T:  02/17/2006  Job:  [id number]

## 2010-11-19 NOTE — Assessment & Plan Note (Signed)
[facility]                           GASTROENTEROLOGY OFFICE NOTE   DILAN, FULLENWIDER                         MRN:          [id number]  DATE:02/14/2006                            DOB:          [birth year]    CHIEF COMPLAINT:  Passing blood.   ASSESSMENTS:  This is a 45 year old African American woman who has had a  several day history of urgent defecation with bleeding and minimal stool  output.  She has had some soreness in the lower abdomen and tenderness  there.  There has been some mucoid discharge as well.  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  There has been no travel or other family  contacts.  Based upon mild abdominal tenderness, I think ischemic colitis is  possible, but much less likely.  Anoscopy examination today does demonstrate  some internal hemorrhoids and there does appear to be some proctitis present  as well.   PLANS:  1. Anal prem.  Cream is given in sample form to be used at least nightly.  2. Cipro.  500 mg twice a day for 7 days.  3. Stool culture and C. difficile tox.  4. Follow up on CBC that was drawn today.   Hemoglobin was 11.7, white count 6.7, MCV 89, platelets 242.   The patient may need a colonoscopy.  Her husband apparently had colon  cancer, and they are concerned about this, but the acute onset sounds like  some sort of acute inflammatory or infectious process to me, most likely.  She will have follow up with Dr. Russella Dar in 7 - 10 days, he has seen her in  consultation previously for odynophagia and neck pain, which has persisted.  She could not afford Nexium twice a day and stopped it.  She did have a  standard Ph probe study performed this month, results not yet out.   HISTORY:  As above.  She developed acute cramping bilateral lower quadrant  pain, and then started passing blood off and on for several days, which has  persisted since Sunday, February 12, 2006.  She has not had problems like this  before, and there is no family contacts with problems.  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  She had some blood today.  Not much in the way of stools.  She does describe irregular bowel habits throughout the years, but no  problems like this.  There is no family history of colon cancer, though her  husband had that.  A brother and an aunt have had colon polyps.   Medications are listed and reviewed in the chart.   Past Medical History:  Reviewed and unchanged from the Chart and as above.   PHYSICAL EXAMINATION:  Reveals an overweight black woman in no acute  distress.  Vital signs show weight of 168 pounds, blood pressure 140/86,  pulse 74.  No CVA tenderness. Skin is warm and dry.  She is not hot.  The  abdomen shows mild left greater than  right lower quadrant tenderness without  organomegaly.  The abdomen is soft.  Bowel sounds are present.  Rectal  examination with female nursing staff present shows no stool.  Mucous is  hemoccult positive.  There is clear mucous present.  An anoscopic  examination is performed which demonstrated some inflamed internal  hemorrhoids and what may be some proctitis changes of the rectum.  Extremities are without edema.   Please note that I have also prescribed hyoscyamine 0.125 mg as needed to  the patient.                                   Iva Boop, MD, [facility]   CEG/MedQ  DD:  02/14/2006  DT:  02/14/2006  Job #:  [id number]   cc:   Venita Lick. Pleas Koch., MD, [facility]

## 2010-11-19 NOTE — Assessment & Plan Note (Signed)
[facility]                           GASTROENTEROLOGY OFFICE NOTE   CHONDRA, BOYDE                       MRN:          [id number]  DATE:04/05/2006                            DOB:          12-28-1965    HISTORY:  Ms. Neyman notes problems with alternating diarrhea and  constipation.  Her rectal bleeding has stopped.  She was noted to have a  normocytic anemia with a hemoglobin of 11.7 by Dr. Leone Payor.  I reviewed the  findings from her esophageal manometry study and 24-hour dual probe  ambulatory pH study performed off all acid suppressants.  She does not have  GERD or any motility disorder.  She is interested in proceeding with  colonoscopy as suggested by Dr. Leone Payor at her office visit on February 14, 2006.  Current medications listed on the chart have been reviewed.  MEDICATION ALLERGIES TO LATEX.   EXAMINATION:  GENERAL:  No acute distress.  VITAL SIGNS:  Weight 171.4 pounds.  Blood pressure is 150/80, pulse 74 and  regular.  CHEST:  Clear to auscultation bilaterally.  CARDIAC:  Regular rate and rhythm without murmurs.  ABDOMEN:  Soft and nontender with normoactive bowel sounds, no palpable  organomegaly, masses or hernias.   ASSESSMENT AND PLAN:  1. Alternating diarrhea and constipation associated with small volume      hematochezia.  Internal hemorrhoids noted on recent anoscopy.  A      brother and an aunt with colon polyps.  Husband with colon cancer.      Rule out colorectal neoplasms.  Risks, benefits and alternatives to      colonoscopy with possible biopsy, possible polypectomy and possible      destruction of internal hemorrhoids discussed with the patient and she      consents to proceed.  This will be scheduled electively.  2. Normocytic anemia.  Repeat CBC today as well as obtaining iron, B12 and      folate levels.Judie Petit T. Russella Dar, MD, [facility]      MTS/MedQ  DD:  04/05/2006  DT:  04/05/2006  Job #:   [id number]   cc:   Lois Huxley, MD

## 2010-11-19 NOTE — Op Note (Signed)
[name], PIZANA NO.:  [id number]   MEDICAL RECORD NO.:  [id number]          PATIENT TYPE:  INP   LOCATION:  1606                         FACILITY:  [facility]   PHYSICIAN:  Anselm Pancoast. 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   OPERATIONS:  Right colectomy. Await final pathology.   ANESTHESIA:  General anesthesia.   SURGEON:  Anselm Pancoast. Zachery Dakins, M.D.   ASSISTANT:  Gita Kudo, M.D.   HISTORY:  Kathy [name] is a 45 year old moderately overweight female who was  referred to my office by Dr. Russella Dar after he did a colonoscopy.  The patient  had guaiac positive stools and had had symptoms of postprandial cramping,  bloating, and had an extensive workup. The colonoscopy showed a fairly  significant size hepatic lesion. On pathology, it was a tubulovillous  adenoma.  He recommended that it be surgically removed, and I was in  agreement.  The patient has no family history of colon cancer, and she never  had any previous colon examination.  There were no other lesions noted on  her colonoscopy, and I recommended we do it through a transverse right  colectomy.  The patient preoperatively was given 3 g of Unasyn.  She has PAS  stockings.  She had a GoLYTELY/erythromycin/neomycin bowel prep.  Her  laboratory studies showed a normal white count, normal hematocrit, and  normal liver function studies.   The patient was positioned on the O.R. table.  Induction of general  anesthesia, endotracheal tube, oral tube into the stomach.  The abdomen was  then prepped with Betadine surgical scrub and solution.  A Foley catheter  had been inserted sterilely, and then she was draped in a sterile manner.  A  right transverse incision was made.  There was significant adipose  tissue,  and the right rectus about 2 fingerbreadths above the umbilicus was divided  with cautery.  There were a couple of vessels that required tying with fine  Vicryl, and then the posterior rectus fascia and peritoneum were picked up  and very carefully entered into the peritoneal cavity.  She has high-lying  cecum. The appendix was present but was not acutely inflamed, and the  lateral peritoneal reflection was opened. The right colon, hepatic flexure  could be rotated into the incision.  There was a little struggle because of  her obesity, and she was also a little gaseous where preoperatively there  appeared to have been a small amount of air.  There were no intra-abdominal  adhesions, and after the hepatic flexure and the area for the anastomosis  was picked, you could feel the lesion right where it had been described at  colonoscopy, and I elected to take the transverse colon right over it, just  to the right of the middle colic. The mesenteric vessels were divided  between [facility]. The larger vessels were doubly ligated with 2-0 silk.  The  small ones were singly ligated, and we freed up about the last 6 inches of  terminal ileum.  There were some nodes that probably were just inflammatory  that were of course excise with the specimen.  I did a stapled GIA TA-60  anastomosis, and the specimen was sent over for pathologist, and said it was  in the center of the specimen with good margins proximally and distally.  The mesenteric defect had been closed with 3-0 silk.  I had placed a few  sutures on the cut surface with the TA-60, and we had also placed a crotch  stitch of 3-0 silk.  The anastomoses was lying without excessive tension.  The small bowel was in good position.  No evidence of any twist of the small  bowel.  Then, the posterior peritoneum and transversalis were closed with  the 0 Vicryl.  The anterior  fascia was closed with a running #1 PDS.  Scarpa's fascia was closed  with 3-  0 Vicryl, and the skin closed with staples.  The patient tolerated the  procedure nicely.  Estimated blood loss was minimal, and we will keep her  n.p.o., but I did not put an NG tube, though I took the oral tube out at  completion of surgery.           ______________________________  Anselm Pancoast. Zachery Dakins, M.D.     WJW/MEDQ  D:  04/18/2006  T:  04/19/2006  Job:  [id number]   cc:   [name], MD, FACG  [address]  [place]  [place] [address]

## 2010-12-16 ENCOUNTER — Encounter: Payer: Medicare Other | Attending: Physical Medicine & Rehabilitation | Admitting: Neurosurgery

## 2010-12-16 ENCOUNTER — Encounter: Payer: Medicare Other | Admitting: Neurosurgery

## 2010-12-16 DIAGNOSIS — M5380 Other specified dorsopathies, site unspecified: Secondary | ICD-10-CM

## 2010-12-16 DIAGNOSIS — IMO0001 Reserved for inherently not codable concepts without codable children: Secondary | ICD-10-CM | POA: Insufficient documentation

## 2010-12-16 DIAGNOSIS — G894 Chronic pain syndrome: Secondary | ICD-10-CM

## 2010-12-16 DIAGNOSIS — M545 Low back pain, unspecified: Secondary | ICD-10-CM | POA: Insufficient documentation

## 2010-12-16 DIAGNOSIS — M129 Arthropathy, unspecified: Secondary | ICD-10-CM | POA: Insufficient documentation

## 2010-12-16 DIAGNOSIS — G8929 Other chronic pain: Secondary | ICD-10-CM | POA: Insufficient documentation

## 2010-12-16 DIAGNOSIS — M722 Plantar fascial fibromatosis: Secondary | ICD-10-CM | POA: Insufficient documentation

## 2010-12-16 DIAGNOSIS — M7660 Achilles tendinitis, unspecified leg: Secondary | ICD-10-CM | POA: Insufficient documentation

## 2010-12-16 DIAGNOSIS — M79609 Pain in unspecified limb: Secondary | ICD-10-CM | POA: Insufficient documentation

## 2010-12-17 NOTE — Assessment & Plan Note (Signed)
This is the patient of Dr. Riley Kill who is seen with chronic low back pain and fibromyalgia.  She tells me today that her right heel has been bothering her.  I did examine, it looks like there has been a bite of some kind at some point and she has a small raised area, but she states that she has seen Dr. Earlene Plater in [place] for plantar fasciitis there and he has begun treatment with injections.  She does not have a Cam walker and is not on anti-inflammatory.  We will try to institute that today.  She rates her pain at about 9.  This is sharp, burning, stabbing pain that has paresthesia-like feelings.  She has pain basically all of her whole body.  General activity level is about 8 or 9.  All activities aggravate at heat and medication tend to help.  Sleep patterns are poor. She walks without assistance.  She does tend to have somewhat of a limp today.  She climb steps and drive.  She can walk 20-30 minutes without any difficulties.  REVIEW OF SYSTEMS:  Notable for those difficulties described above as well as some weight gain, night sweats, rash, abdominal pain, poor appetite, depression, anxiety, no thoughts of suicidal ideation and no signs of aberrant behaviors.  PAST MEDICAL HISTORY:  Unchanged.  SOCIAL HISTORY:  She is single, lives with her mother.  FAMILY HISTORY:  Unchanged.  PHYSICAL EXAMINATION:  VITAL SIGNS:  Blood pressure 159/89, pulse 93, respirations 18 and O2 sats 98 on room air. NEUROLOGIC:  Motor strength is 5/5 in lower extremities including iliopsoas and quadriceps.  Sensation is intact.  Any resistance to testing elicits pain.  ASSESSMENT: 1. Chronic low back pain. 2. Facet arthropathy. 3. Fibromyalgia. 4. Probable right plantar fasciitis. 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 1. History of cystitis.  PLAN: 1. We will go ahead and refill her Opana ER 10 mg 1 q.12, 60 with no     refill. 2. Oxycodone 15 mg 1 p.o. q.8 h.  p.r.n. 90 with no refills. 3. We are going to start her on Mobic 15 mg daily.  I gave her 30 with     2 refills. 4. We will have her contact Dr. Philis Pique office to see if they could     possibly put her in a Cam walker until she follows up with them at     least during the day for activities for rest that heal and give the     Mobic it tends to work.  She is going to call them when she gets     home. 5. She is going to start physical therapy per Dr. Philis Pique office for     her heal and hopefully the work with her     back some to.  We will see her back here in the clinic in a month.     Her questions were encouraged and answered.     Tori Cupps L. Blima Dessert Electronically Signed    RLW/MedQ D:  12/16/2010 11:30:30  T:  12/17/2010 02:29:05  Job #:  [id number]

## 2010-12-21 ENCOUNTER — Other Ambulatory Visit: Payer: Self-pay | Admitting: Physical Medicine & Rehabilitation

## 2010-12-21 DIAGNOSIS — M545 Low back pain, unspecified: Secondary | ICD-10-CM

## 2010-12-21 DIAGNOSIS — M5416 Radiculopathy, lumbar region: Secondary | ICD-10-CM

## 2010-12-25 ENCOUNTER — Ambulatory Visit (HOSPITAL_COMMUNITY)
Admission: RE | Admit: 2010-12-25 | Discharge: 2010-12-25 | Disposition: A | Discharge status: Home / Self Care | Payer: Medicare Other | Source: Ambulatory Visit | Attending: Physical Medicine & Rehabilitation | Admitting: Physical Medicine & Rehabilitation

## 2010-12-25 DIAGNOSIS — M47817 Spondylosis without myelopathy or radiculopathy, lumbosacral region: Secondary | ICD-10-CM | POA: Insufficient documentation

## 2010-12-25 DIAGNOSIS — M545 Low back pain, unspecified: Secondary | ICD-10-CM | POA: Insufficient documentation

## 2010-12-25 DIAGNOSIS — R209 Unspecified disturbances of skin sensation: Secondary | ICD-10-CM | POA: Insufficient documentation

## 2010-12-25 DIAGNOSIS — M5416 Radiculopathy, lumbar region: Secondary | ICD-10-CM

## 2011-01-12 ENCOUNTER — Encounter: Payer: Medicare Other | Attending: Physical Medicine & Rehabilitation | Admitting: Neurosurgery

## 2011-01-12 DIAGNOSIS — M79609 Pain in unspecified limb: Secondary | ICD-10-CM | POA: Insufficient documentation

## 2011-01-12 DIAGNOSIS — G8929 Other chronic pain: Secondary | ICD-10-CM | POA: Insufficient documentation

## 2011-01-12 DIAGNOSIS — M129 Arthropathy, unspecified: Secondary | ICD-10-CM | POA: Insufficient documentation

## 2011-01-12 DIAGNOSIS — IMO0001 Reserved for inherently not codable concepts without codable children: Secondary | ICD-10-CM | POA: Insufficient documentation

## 2011-01-12 DIAGNOSIS — M5380 Other specified dorsopathies, site unspecified: Secondary | ICD-10-CM

## 2011-01-12 DIAGNOSIS — M722 Plantar fascial fibromatosis: Secondary | ICD-10-CM | POA: Insufficient documentation

## 2011-01-12 DIAGNOSIS — M7660 Achilles tendinitis, unspecified leg: Secondary | ICD-10-CM | POA: Insufficient documentation

## 2011-01-12 DIAGNOSIS — M545 Low back pain, unspecified: Secondary | ICD-10-CM | POA: Insufficient documentation

## 2011-01-12 NOTE — Assessment & Plan Note (Signed)
Ms. Nack returns today with complaints of low back pain.  She reports her pain is unchanged.  She does not rate it today.  It is a sharp burning type pain with stabbing, tingling, and aching.  General activity level is 8 or 9.  Sleep patterns are poor.  Pain is worse in the daytime, even at night.  All activities aggravate pain.  Rest, medication, injections tend to help at times.  She is on disability.  REVIEW OF SYSTEMS:  Notable for the difficulties as described above as well as some tingling, trouble with ambulation, dizziness, confusion, anxiety, no suicidal thoughts or aberrant behavior.  She does have some skin breakdown, vomiting, diarrhea, abdominal pain, poor appetite from time to time.  PAST MEDICAL HISTORY:  Unchanged.  SOCIAL HISTORY:  She is divorced.  She lives with her mother.  FAMILY HISTORY:  Significant for diabetes, hypertension, disabilities.  PHYSICAL EXAMINATION:  VITAL SIGNS:  Blood pressure 160/84, pulse 73, respirations 18, O2 sats 98 on room air. MUSCULOSKELETAL:  Motor strength is good in the lower extremities.  Her sensation is intact.  She has normal gait. CONSTITUTION:  She is within normal limits.  She is alert and oriented x3.  We did talk about her MRI today, we obtained of the lumbar spine in June 2012, and the impression was read, she has slight progression of mild degenerative facet arthritis, L3-4, left greater than right, otherwise normal.  I explained this to the patient.  She is going to continue her current treatment regimen.  IMPRESSION: 1. Chronic low back pain. 2. Facet arthropathy. 3. Fibromyalgia. 4. History of right Achilles tendinopathy.  PLAN: 1. She will continue her home exercise program. 2. We refilled her oxycodone 50 mg 1 p.o. q.8 hours, 90 with no     refill. 3. Opana ER 10 mg 1 p.o. q.12 hours, 60 with no refill.   She will be back here in 1 month for followup.  Her questions were encouraged and  answered.     Ervan Heber L. Blima Dessert Electronically Signed    RLW/MedQ D:  01/12/2011 10:08:57  T:  01/12/2011 20:49:19  Job #:  [id number]

## 2011-02-15 ENCOUNTER — Encounter: Payer: Medicare Other | Attending: Physical Medicine & Rehabilitation | Admitting: Physical Medicine & Rehabilitation

## 2011-02-15 DIAGNOSIS — M129 Arthropathy, unspecified: Secondary | ICD-10-CM | POA: Insufficient documentation

## 2011-02-15 DIAGNOSIS — G8929 Other chronic pain: Secondary | ICD-10-CM | POA: Insufficient documentation

## 2011-02-15 DIAGNOSIS — M5137 Other intervertebral disc degeneration, lumbosacral region: Secondary | ICD-10-CM

## 2011-02-15 DIAGNOSIS — IMO0001 Reserved for inherently not codable concepts without codable children: Secondary | ICD-10-CM | POA: Insufficient documentation

## 2011-02-15 DIAGNOSIS — M545 Low back pain, unspecified: Secondary | ICD-10-CM | POA: Insufficient documentation

## 2011-02-15 DIAGNOSIS — M7660 Achilles tendinitis, unspecified leg: Secondary | ICD-10-CM | POA: Insufficient documentation

## 2011-02-15 DIAGNOSIS — M533 Sacrococcygeal disorders, not elsewhere classified: Secondary | ICD-10-CM

## 2011-02-15 DIAGNOSIS — M76899 Other specified enthesopathies of unspecified lower limb, excluding foot: Secondary | ICD-10-CM

## 2011-02-15 DIAGNOSIS — M2170 Unequal limb length (acquired), unspecified site: Secondary | ICD-10-CM

## 2011-02-15 NOTE — Assessment & Plan Note (Signed)
Kathy [name] is back regarding her pain.  She stopped the Opana as she was afraid it was causing loose stools and rash, but this apparently was not the cause.  Her stools have backed off a bit.  She is still taking a probiotic and is on Amitiza.  She wants to try the Opana again even perhaps at a higher dose.  She had used the MS Contin before but wants to try something different even though she perhaps tolerated a bit better.  She uses oxycodone 15 for breakthrough pain and feels that this works the best for her pain.  Pain level is 9/10, describes as constant, describes it also as burning, stabbing, sometimes tingling in the toes and aching in the back and shoulders.  Sleep is fair.  Review of systems notable for the above.  Full 12-point review is in the written health and history section of the chart.  She does report the rash in the inguinal folds.  SOCIAL HISTORY:  The patient is single living with her mother.  Mother is experiencing some healthcare problems currently.  Physical exam; blood pressure is 147/78, pulse 60, respiratory rate 18, she is satting 99% on room air.  The patient is pleasant, alert, and oriented x3.  Affect is bright and appropriate.  She is moving fairly well.  She is a bit limited still with lumbar flexion, pain in the lumbar spine with palpation.  Her strength is generally 4-5/5 in all limbs.  Abdomen is unchanged with chronic changes from her operations noted.  Inguinal folds were examined and she had some mild redness in rash which appeared to be fungal.  ASSESSMENT: 1. Chronic low back pain with facet arthropathy. 2. Fibromyalgia. 3. History of right Achilles tendinopathy.  PLAN: 1. We will retrial Opana ER increasing to 20 mg q.12 h.  She was     counseled on possible side effects.  She will need to work on     maintaining her bowel regimen. 2. We will refill oxycodone 15 one q.8 h. p.r.n. #90. 3. Also refill B12 and Savella today.  Consider changing  B12 to every     2 weeks or monthly.  Consider B12 level at next visit. 4. We recommended like Micro-Guard powder for inguinal rash. 5. I will see her back in next month.     Ranelle Oyster, M.D. Electronically Signed    ZTS/MedQ D:  02/15/2011 11:19:59  T:  02/15/2011 20:01:02  Job #:  [id number]

## 2011-03-15 ENCOUNTER — Encounter: Payer: Medicare Other | Attending: Physical Medicine & Rehabilitation | Admitting: Physical Medicine & Rehabilitation

## 2011-03-15 DIAGNOSIS — M7660 Achilles tendinitis, unspecified leg: Secondary | ICD-10-CM

## 2011-03-15 DIAGNOSIS — M722 Plantar fascial fibromatosis: Secondary | ICD-10-CM

## 2011-03-15 DIAGNOSIS — IMO0001 Reserved for inherently not codable concepts without codable children: Secondary | ICD-10-CM

## 2011-03-15 DIAGNOSIS — M545 Low back pain, unspecified: Secondary | ICD-10-CM | POA: Insufficient documentation

## 2011-03-15 DIAGNOSIS — M5137 Other intervertebral disc degeneration, lumbosacral region: Secondary | ICD-10-CM

## 2011-03-15 DIAGNOSIS — M129 Arthropathy, unspecified: Secondary | ICD-10-CM | POA: Insufficient documentation

## 2011-03-15 NOTE — Assessment & Plan Note (Signed)
Kathy [name] is back regarding her diffuse pain.  Her biggest complaint is ongoing pain in her right ankle and foot.  She has seen a podiatrist and has been placed on an ASO as well as has gone to physical therapy and really has not seen a lot of improvement.  She does have some inserts in her shoes, but is not wearing those regularly.  Pain is about 8-9/10. She had occasional pinching in her low back, it is worse when she stands or if she sits for long time, it seems to be worse.  Sleep is poor.  She does feel that Opana ER is helping some of her diffuse pain.  She remains on B12 and Savella.  Fungal infection in her inguinal region seems to be improving.  REVIEW OF SYSTEMS:  Notable for spasms, depression, anxiety, vomiting, nausea, abdominal pain, weight loss.  Full 12-point review is in the written health and history section of the chart.  SOCIAL HISTORY:  The patient is divorced, living with her mother and brother.  PHYSICAL EXAMINATION:  VITAL SIGNS:  Blood pressure is 164/84, pulse 68, respiratory rate 16, she is satting 100% on room air. GENERAL:  The patient is pleasant, alert, and has lost noticeable weight. MUSCULOSKELETAL:  She had some pain in the right lower lumbar spine worse with extension and facet maneuvers to the right.  Strength remains 4-5/5.  Exam in the right foot, there is pain along the medial ankle ligaments worse with passive dorsiflexion as well as inversion of the foot.  She has some mild tenderness along the Achilles tendon.  There was no swelling.  In standing, she has significant pronation.  No bruising is seen in the leg.  No numbness, breakdown, etc. NEUROLOGIC:  Cognitively, she is alert and appropriate.  Her mood seemed to be pleasant and upbeat.  ASSESSMENT: 1. Chronic low back pain, facet arthropathy. 2. Fibromyalgia. 3. History of right Achilles tendinopathy, although exam today is more     noticeable for hyperpronation and stressed upon the medial  ligament     complex and medial plantar fascia.  PLAN: 1. Recommended patient invest in some reasonable shoes with medial     arch supports.  She has arch supports already.  She can put these     in some supportive shoes.  I do not believe that her Aircast that     she is wearing today is really doing her much good. 2. Recommended ice as well t.i.d. with no Voltaren gel mixed in.  I am     not anxious really to inject this area as there is no focal     pathology and seems to be more of a functional problem of these     multiple areas. 3. We refilled her oxycodone 15 mg q.8 hours p.r.n. and Opana 20 mg     q.12 hours #90 and #60 respectively. 4. I will see the patient back in 2 months.  She will see my nurse     practitioner back in a month.     Ranelle Oyster, M.D. Electronically Signed    ZTS/MedQ D:  03/15/2011 13:30:08  T:  03/15/2011 21:59:53  Job #:  [id number]

## 2011-03-21 ENCOUNTER — Ambulatory Visit: Payer: Medicare Other | Admitting: Physical Medicine & Rehabilitation

## 2011-04-13 ENCOUNTER — Encounter: Payer: Medicare Other | Attending: Neurosurgery | Admitting: Neurosurgery

## 2011-04-13 DIAGNOSIS — M545 Low back pain, unspecified: Secondary | ICD-10-CM | POA: Insufficient documentation

## 2011-04-13 DIAGNOSIS — IMO0001 Reserved for inherently not codable concepts without codable children: Secondary | ICD-10-CM

## 2011-04-13 DIAGNOSIS — G894 Chronic pain syndrome: Secondary | ICD-10-CM

## 2011-04-13 DIAGNOSIS — G8929 Other chronic pain: Secondary | ICD-10-CM | POA: Insufficient documentation

## 2011-04-13 NOTE — Assessment & Plan Note (Signed)
ACCOUNT:  [id number].  This patient of Dr. Riley Kill seen for chronic pain complaints, mostly in her right buttock down the right leg and right ankle.  She states she is having some right foot pain that has continued.  She was told by a podiatrist may be plantar fasciitis and they are deciding whether they are going to operate or continue to treat conservatively.  She is having some symptoms in the left foot.  She has her AFO on her right foot.  She rates her pain at 8-10.  It is a sharp, tingling, aching, stabbing constant pain.  General activity level is 8-9.  Sleep patterns are poor. Pain is worse with walking, bending, standing and activity.  Rest, medication tend to help.  She walks without assistance, except for the AFO is in place.  She does climb steps and drive.  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  She needs help with bathing, shopping and household duties.  REVIEW OF SYSTEMS:  Notable for the difficulties described above as well as some weight gain, night sweats, vomiting, nausea and poor appetite, urinary retention, weakness, numbness, tingling, trouble walking, dizziness, confusion, depression, anxiety, some diminished taste or smell.  No suicidal thoughts or aberrant behaviors.  Last UDS was consistent.  Pill counts are correct.  PAST MEDICAL HISTORY, SOCIAL HISTORY, AND FAMILY HISTORY:  Unchanged.  PHYSICAL EXAM:  Blood pressure is 141/83, pulse 62, respirations 16, O2 sats 98 on room air.  Motor strength is about 4/5 in the lower extremities.  She does give-way to pain, although her sensation is intact.  Constitutionally, she is within normal limits.  She is alert and oriented x3.  She has a limp to her gait.  The patient is tender with inversion and external rotation of the ankles on both sides.  ASSESSMENT: 1. Chronic low back pain. 2. Fibromyalgia. 3. ? Plantar fasciitis tendinopathy in her feet.  PLAN: 1. Refill Opana ER 20 mg 1 p.o. q.12 hours 60 with no  refill. 2. Oxycodone 15 mg 1 p.o. q.8 hours p.r.n. #90 with no refill. 3. She has asked about orthopedic exam.  She has seen Dr. Daleen Squibb in     [place] and is now seeing a podiatrist.  I have recommended she is     to see Dr. Leonides Grills at [facility], she is in     agreement with that, so I had made the referral for her to get her     foot evaluated there.  She will see Dr. Riley Kill back in 1 month.     Her questions were encouraged and answered.     Mabry Santarelli L. Blima Dessert Electronically Signed    RLW/MedQ D:  04/13/2011 09:37:48  T:  04/13/2011 10:40:48  Job #:  [id number]

## 2011-04-14 ENCOUNTER — Other Ambulatory Visit: Payer: Self-pay | Admitting: Podiatry

## 2011-04-14 DIAGNOSIS — M79609 Pain in unspecified limb: Secondary | ICD-10-CM

## 2011-04-14 DIAGNOSIS — M722 Plantar fascial fibromatosis: Secondary | ICD-10-CM

## 2011-04-14 DIAGNOSIS — M7660 Achilles tendinitis, unspecified leg: Secondary | ICD-10-CM

## 2011-04-14 DIAGNOSIS — M76829 Posterior tibial tendinitis, unspecified leg: Secondary | ICD-10-CM

## 2011-04-18 ENCOUNTER — Other Ambulatory Visit: Payer: Medicare Other

## 2011-04-21 ENCOUNTER — Ambulatory Visit
Admission: RE | Admit: 2011-04-21 | Discharge: 2011-04-21 | Disposition: A | Discharge status: Home / Self Care | Payer: Medicare Other | Source: Ambulatory Visit | Attending: Podiatry | Admitting: Podiatry

## 2011-04-21 DIAGNOSIS — M722 Plantar fascial fibromatosis: Secondary | ICD-10-CM

## 2011-04-21 DIAGNOSIS — M76829 Posterior tibial tendinitis, unspecified leg: Secondary | ICD-10-CM

## 2011-04-21 DIAGNOSIS — M79609 Pain in unspecified limb: Secondary | ICD-10-CM

## 2011-04-21 DIAGNOSIS — M7660 Achilles tendinitis, unspecified leg: Secondary | ICD-10-CM

## 2011-05-12 ENCOUNTER — Encounter: Payer: Medicare Other | Attending: Neurosurgery | Admitting: Neurosurgery

## 2011-05-12 DIAGNOSIS — G894 Chronic pain syndrome: Secondary | ICD-10-CM

## 2011-05-12 DIAGNOSIS — IMO0001 Reserved for inherently not codable concepts without codable children: Secondary | ICD-10-CM

## 2011-05-12 DIAGNOSIS — M545 Low back pain, unspecified: Secondary | ICD-10-CM | POA: Insufficient documentation

## 2011-05-12 DIAGNOSIS — G8929 Other chronic pain: Secondary | ICD-10-CM | POA: Insufficient documentation

## 2011-05-12 NOTE — Assessment & Plan Note (Signed)
HISTORY OF PRESENT ILLNESS:  This is a patient of Dr. Riley Kill seen for chronic pain complaints widespread.  She is mostly having problems with her right foot now and she will be having surgery with the podiatrist on that foot in about 2 weeks and will follow up here in a month.  She rates her average pain at a 9.  It is a sharp, burning, stabbing, tingling, aching pain.  General activity level is 7-10.  Pain is same 24 hours a day.  Sleep patterns are poor.  Pain is worse with walking, bending, and activity.  Rest, medication, and injections help.  She walks without assistance.  She climb steps and drives.  She is on disability.  She does need help with household duties and ADLs.  REVIEW OF SYSTEMS:  Notable for difficulties described above as well as some weight fluctuations, skin issues, constipation, nausea, urinary retention, abdominal pain, poor appetite, trouble with ambulation, paresthesias, depression, anxiety.  No suicidal thoughts or aberrant behaviors.  Her last pill count and UDS are consistent.  PAST MEDICAL HISTORY/SOCIAL HISTORY/FAMILY HISTORY:  Unchanged.  PHYSICAL EXAMINATION:  VITAL SIGNS:  Blood pressure 160/89, pulse 59, respirations 16, O2 sats 100 on room air. NEUROLOGIC:  Motor strength and sensation intact.  Constitutionally, she is within normal limits.  She is alert and oriented x3, somewhat depressed, but overall okay.  She is going to be having the surgery with Dr. Harriet Pho, who is a podiatrist here in town.  ASSESSMENT: 1. Chronic low back pain. 2. Fibromyalgia. 3. Pending podiatry surgery for several corrective issues and matters     she has with her right foot.  PLAN: 1. Refill Opana ER 20 mg 1 p.o. q.12 hours, 60 with no refill. 2. Oxycodone 15 mg 1 p.o. q.8 hours p.r.n.  I did up it from 90 to 120     to help with her postop pain.  She understands that will probably a     1 time prescription.  She will follow up with her podiatrist in 2     weeks  after surgery for dressing change and follow up here in a     month.  Her questions were encouraged and answered.  She did not     ask about a handicap sticker.  She can get that through her     podiatry office, unsure for short-term.  It is also on her sheet     that she want [place] to write the Xanax but she never mentioned that.     No prescription was given for that.  We will see her back here in a     month.     Kathy [name] Electronically Signed    RLW/MedQ D:  05/12/2011 11:24:27  T:  05/12/2011 11:56:41  Job #:  [id number]

## 2011-06-02 ENCOUNTER — Encounter: Payer: Medicare Other | Attending: Neurosurgery | Admitting: Neurosurgery

## 2011-06-02 DIAGNOSIS — IMO0001 Reserved for inherently not codable concepts without codable children: Secondary | ICD-10-CM

## 2011-06-02 DIAGNOSIS — M545 Low back pain, unspecified: Secondary | ICD-10-CM | POA: Insufficient documentation

## 2011-06-02 DIAGNOSIS — G8929 Other chronic pain: Secondary | ICD-10-CM | POA: Insufficient documentation

## 2011-06-02 DIAGNOSIS — G894 Chronic pain syndrome: Secondary | ICD-10-CM

## 2011-06-02 NOTE — Assessment & Plan Note (Signed)
This is a patient of Dr. Riley Kill seen for chronic pain.  This is widespread.  She did have a surgery on her right foot and has Cam walker.  Today, she states her sutures were removed yesterday by the podiatrist and she is partial weightbearing.  She rates her average pain at 8-10.  It is a sharp, burning, stabbing, constant pain.  General activity level of 10, pain is same 24 hours a day.  Sleep patterns are poor.  All activities aggravate.  Rest, heat, medication, injections help.  She walks with and without assistance.  She does not drive. Functionally, she is on disability.  She needs help with ADLs and household duties.  REVIEW OF SYSTEMS:  Notable for difficulties described above as well as night sweats, skin rashes, constipation, vomiting, urinary retention, abdominal pain, poor appetite, limb swelling, shortness of breath, bladder control issues, numbness, tingling, trouble walking, spasms, dizziness, confusion, anxiety.  No suicidal thoughts or aberrant behaviors.  Last pill count and UDS consistent.  Past medical history, social history, family history are unchanged.  PHYSICAL EXAMINATION:  VITAL SIGNS:  Blood pressure is 142/77, pulse 62, respirations 16, O2 sats 98 on room air. NEUROLOGIC:  Her motor strength and sensation are intact, not tested on the right lower extremity due to the Cam walker.  Constitutionally, she is within normal limits.  She is alert and oriented x3.  She does have a limp.  ASSESSMENT: 1. Chronic low back pain. 2. Fibromyalgia. 3. Recent history of podiatry surgery on right lower extremity.  PLAN: 1. Refill Opana ER 20 mg 1 p.o. q.12 hours 60 with no refill. 2. Oxycodone 15 mg 1 p.o. q.8 hours p.r.n. 120 with no refill. She states she did get a small amount of Percocet from the podiatrist and was told not to obtain any more prescriptions from anywhere, but here and she understands that.  She states the podiatrist did tell her that they would not  give her any more medicines.  Otherwise, her questions were encouraged and answered.  I will see her in a month.     Shavon Ashmore L. Blima Dessert Electronically Signed    RLW/MedQ D:  06/02/2011 10:59:26  T:  06/02/2011 12:40:21  Job #:  [id number]

## 2011-06-10 ENCOUNTER — Ambulatory Visit: Payer: Medicare Other | Admitting: Physical Medicine & Rehabilitation

## 2011-06-16 ENCOUNTER — Encounter: Payer: Medicare Other | Attending: Neurosurgery | Admitting: Neurosurgery

## 2011-06-16 DIAGNOSIS — G8929 Other chronic pain: Secondary | ICD-10-CM | POA: Insufficient documentation

## 2011-06-16 DIAGNOSIS — IMO0001 Reserved for inherently not codable concepts without codable children: Secondary | ICD-10-CM

## 2011-06-16 DIAGNOSIS — M545 Low back pain, unspecified: Secondary | ICD-10-CM

## 2011-06-16 DIAGNOSIS — G894 Chronic pain syndrome: Secondary | ICD-10-CM

## 2011-06-17 NOTE — Assessment & Plan Note (Signed)
This is a patient of Dr. Riley Kill, seen for chronic pain.  She came in today early.  She had an appointment next week for med refills.  She wanted [place] to take over her Xanax prescription.  She states that her new primary care does not want to prescribe as her other primary care did and Mental Health also has declined.  She has been reliable with her other medicines thus far.  She rates no change in her pain at 8 or 9. It is a sharp, burning, stabbing, tingling, and aching pain.  General activity level is 9-10.  Pain is worse during the day, evening, and night.  Sleep patterns are poor.  Rest and medication tends to help. She walks with and without assistance.  She uses a walker at times.  She is independent.  Today, she does climb steps.  She does not drive.  She needs help with transfers.  She is on disability.  Needs help with ADLs and household duties.  REVIEW OF SYSTEMS:  Notable for the difficulties as described above as well as some weakness, tingling, trouble walking, confusion, depression, anxiety.  No suicidal thoughts or aberrant behaviors.  She also has some abdominal pain, poor appetite, limb swelling, as well as fevers, chills, night sweats, and weight loss.  PAST MEDICAL HISTORY:  Unchanged.  SOCIAL HISTORY:  Unchanged.  FAMILY HISTORY:  Unchanged.  PHYSICAL EXAMINATION:  VITAL SIGNS:  Blood pressure 131/71, pulse 81, respirations 20, O2 sats 98 on room air. NEUROLOGIC:  Motor strength and sensation are intact.  Constitutionally, she is within normal limits.  She is alert and oriented x3.  She has a slight limp to her gait.  She does have a CAM walker on today under the care of another physician for podiatry surgery on her right foot.  PLAN: 1. We will refill her Opana and oxycodone next week just with a pill     count.  She did not bring this with her today.  She will come back     when she needs those and we will just do the count and refill those     if her things  are accurate. 2. I did agree to write her Xanax for her, but instead of 1 mg three     times a day, I told her I would give her 0.5 two times a day and     she agreed to that.  We gave her prescription for #60 with no     refill.  Her questions were encouraged and answered.  We will see     her back as scheduled.     Hawkin Charo L. Blima Dessert Electronically Signed    RLW/MedQ D:  06/16/2011 11:21:53  T:  06/17/2011 01:39:50  Job #:  [id number]

## 2011-06-30 ENCOUNTER — Ambulatory Visit: Payer: Medicare Other | Admitting: Neurosurgery

## 2011-07-14 ENCOUNTER — Encounter: Payer: Medicare Other | Attending: Neurosurgery | Admitting: Neurosurgery

## 2011-07-14 DIAGNOSIS — M545 Low back pain, unspecified: Secondary | ICD-10-CM | POA: Insufficient documentation

## 2011-07-14 DIAGNOSIS — G8929 Other chronic pain: Secondary | ICD-10-CM | POA: Insufficient documentation

## 2011-07-14 DIAGNOSIS — G894 Chronic pain syndrome: Secondary | ICD-10-CM

## 2011-07-14 DIAGNOSIS — M25559 Pain in unspecified hip: Secondary | ICD-10-CM

## 2011-07-14 DIAGNOSIS — M25569 Pain in unspecified knee: Secondary | ICD-10-CM

## 2011-07-15 NOTE — Assessment & Plan Note (Signed)
This is a patient of Dr. Riley Kill, seen for chronic pain and multiple pain complaints.  She does have a podiatry surgical boot on her right foot, still followed by the surgeon.  General pain level is 8-9, it is sharp, burning, dull, stabbing pain.  General activity level is 10.  Sleep patterns are poor. Walking, bending, standing, and activity aggravate; rest, medication, and injections help.  She walks without assistance. She does climb steps and drive.  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  REVIEW OF SYSTEMS:  Notable for difficulties as described above as well as some weight fluctuations, skin breakdown, constipation, nausea, abdominal pain, confusion, anxiety, loss of taste and smell, paresthesias.  She has marked suicidal thoughts, but states she has no plan and was encouraged to follow up with the ED or primary care if that worsens.  Past medical history, social history, family history are unchanged.  PHYSICAL EXAMINATION:  VITAL SIGNS:  Her blood pressure is 145/65, pulse 67, respirations 16, O2 sat is 98 on room air. MUSCULOSKELETAL:  Motor strength sensation are intact, except for right lower extremity. NEUROLOGIC:  Constitutionally, she is within normal limits.  She is alert and oriented x3, slight limp to her gait due to the [employer].  ASSESSMENT: 1. Chronic pain. 2. Complaints of low back pain that radiate up the spine. 3. Question fibromyalgia due to her multiple pain complaints.  PLAN: 1. Refill oxycodone 50 mg 1 p.o. q.8 hours p.r.n., 120 with no refill. 2. Opana ER 20 mg 1 p.o. q.12 hours 60 with no refill. 3. Xanax 0.5 one p.o. b.i.d., 60 with no refills.  Questions were encouraged and answered.  We will see her back in a month.     Moe Graca L. Blima Dessert Electronically Signed    RLW/MedQ D:  07/14/2011 10:56:54  T:  07/15/2011 04:13:45  Job #:  [id number]

## 2011-07-18 NOTE — Assessment & Plan Note (Signed)
This is a patient of Dr. Riley Kill is seen for pain syndrome in her back. She has had right foot surgery with podiatrist.  There has been issues with Kathy [name]'s pill counts in the past and medication compliance,  her mother had an appointment today.  I asked her to come in with her for a pill count again today for Opana ER 20 mg 1 every 12 hours.  She received 60 on December 4 and has 5 left today.  She is again stating that she has taken more than she should due to pain.  I explained to the patient many times this is unacceptable.  Her oxycodone was filled on the 11th, she has 36/120 left which is again unacceptable.  Her sig for the Oxy 15 is 1 ever 8 hours p.r.n. and in 16 days.  She has taken over 80 pills.  This note is just to be placed on the chart for Dr. Bernerd Pho review.  She is going to follow up with him in 2-3 weeks and they may very well be taken off the narcotics due to the continued noncompliance.     Kathy [name] Electronically Signed    RLW/MedQ D:  06/30/2011 11:19:14  T:  07/01/2011 04:32:40  Job #:  [id number]

## 2011-07-22 ENCOUNTER — Ambulatory Visit: Payer: Medicare Other | Admitting: Physical Medicine & Rehabilitation

## 2011-07-29 ENCOUNTER — Ambulatory Visit: Payer: Medicare Other | Admitting: Neurosurgery

## 2011-08-12 ENCOUNTER — Encounter: Payer: Medicare Other | Attending: Physical Medicine & Rehabilitation | Admitting: Physical Medicine & Rehabilitation

## 2011-08-12 DIAGNOSIS — M5137 Other intervertebral disc degeneration, lumbosacral region: Secondary | ICD-10-CM

## 2011-08-12 DIAGNOSIS — M545 Low back pain, unspecified: Secondary | ICD-10-CM | POA: Insufficient documentation

## 2011-08-12 DIAGNOSIS — M533 Sacrococcygeal disorders, not elsewhere classified: Secondary | ICD-10-CM

## 2011-08-12 DIAGNOSIS — IMO0001 Reserved for inherently not codable concepts without codable children: Secondary | ICD-10-CM | POA: Insufficient documentation

## 2011-08-12 DIAGNOSIS — M25579 Pain in unspecified ankle and joints of unspecified foot: Secondary | ICD-10-CM | POA: Insufficient documentation

## 2011-08-12 DIAGNOSIS — M2170 Unequal limb length (acquired), unspecified site: Secondary | ICD-10-CM

## 2011-08-12 DIAGNOSIS — M76899 Other specified enthesopathies of unspecified lower limb, excluding foot: Secondary | ICD-10-CM

## 2011-08-12 DIAGNOSIS — R109 Unspecified abdominal pain: Secondary | ICD-10-CM | POA: Insufficient documentation

## 2011-08-12 DIAGNOSIS — G894 Chronic pain syndrome: Secondary | ICD-10-CM | POA: Insufficient documentation

## 2011-08-13 NOTE — Assessment & Plan Note (Signed)
Kathy [name] is back regarding her multiple pain complaints.  She had surgery in November on her left foot and sounds as she has had a plantar fascia release.  There was also some work done on her toes by the podiatrist. I am unclear exactly what was done.  Her heel pain is improved. However, she is having some medial ankle and perhaps some arch pain. Pain is 9/10.  Her big toe was sore as well on the right foot.  Back remains an issue at times as she has had some recent neck tightness. Her pain is a 9-10/10.  She has not changed her medication regimen despite being given some more oxycodone last month by my nurse practitioner.  She states that she may need more surgery on her bowels this year and is going back to [facility] for a followup visit here over the next month or so.  Sleep is poor.  REVIEW OF SYSTEMS:  Notable for the above.  Does report some nausea, vomiting, constipation, weight gain, wheezing, and shortness of breath. Full 12-point review is written health and history section of the chart.  SOCIAL HISTORY:  The patient is single.  Mother is with her today for support.  PHYSICAL EXAMINATION:  VITAL SIGNS:  Blood pressure 170/95, pulse 76, respiratory rate 16, and she is saturating 97% on room air. GENERAL:  The patient is generally pleasant.  Affect is about the same as it always is for her.  She is sitting fairly comfortably on the exam table. MUSCULOSKELETAL:  She has her left foot out and toe was wrapped in [place].  There is minimal pain over the heel, although there is some pain over the medial aspect of the arch and into the medial ankle.  There is pain with inversion of the foot as well as forced dorsiflexion and plantar flexion today.  No swelling was seen.  Arch did not hold up as well when she stood on it as previously either.  Low back is generally tender with palpation and she has persistent abnormal abdominal pain per baseline.  She has generalized tightness of left  trapezius muscle with palpation, although no focal trigger points were noted.  Cognitively, she was alert and generally appropriate.  ASSESSMENT: 1. Chronic pain syndrome/fibromyalgia. 2. Chronic abdominal pain and low back pain. 3. Recent foot surgery for plantar fasciitis.  She seems to have some     medial ankle and arch stability currently in my opinion.  PLAN: 1. Recommended adequate arch support shoe wear.  Currently, she is     limited due to the recent toe surgery she had. 2. The patient requested increases in her narcotics and I discussed     with her the fact that she should not be doing this, given the     amount she is already taking as this will not likely to provide her     adequate relief.  She will stay with the oxycodone 15 mg 1 q.8 h     p.r.n. and Opana ER 20 mg q.12 h. 3. Advised heat, stretching, and other modalities to the left shoulder     for now. 4. I told the patient we could make a referral to the [facility]     Surgery if she wanted another opinion regarding her abdomen.     Ranelle Oyster, M.D. Electronically Signed    ZTS/MedQ D:  08/12/2011 14:39:21  T:  08/13/2011 02:51:02  Job #:  [id number]

## 2011-09-08 ENCOUNTER — Encounter: Payer: Self-pay | Admitting: *Deleted

## 2011-09-08 ENCOUNTER — Encounter: Payer: Medicare Other | Attending: Physical Medicine & Rehabilitation | Admitting: *Deleted

## 2011-09-08 VITALS — BP 148/81 | HR 80 | Resp 18 | Ht 65.0 in | Wt 175.0 lb

## 2011-09-08 DIAGNOSIS — M545 Low back pain, unspecified: Secondary | ICD-10-CM | POA: Insufficient documentation

## 2011-09-08 DIAGNOSIS — M5137 Other intervertebral disc degeneration, lumbosacral region: Secondary | ICD-10-CM

## 2011-09-08 DIAGNOSIS — G8929 Other chronic pain: Secondary | ICD-10-CM | POA: Insufficient documentation

## 2011-09-08 DIAGNOSIS — IMO0001 Reserved for inherently not codable concepts without codable children: Secondary | ICD-10-CM | POA: Insufficient documentation

## 2011-09-08 DIAGNOSIS — R109 Unspecified abdominal pain: Secondary | ICD-10-CM | POA: Insufficient documentation

## 2011-09-08 DIAGNOSIS — G894 Chronic pain syndrome: Secondary | ICD-10-CM | POA: Insufficient documentation

## 2011-09-08 DIAGNOSIS — M5380 Other specified dorsopathies, site unspecified: Secondary | ICD-10-CM

## 2011-09-08 DIAGNOSIS — M539 Dorsopathy, unspecified: Secondary | ICD-10-CM

## 2011-09-08 MED ORDER — OXYCODONE HCL 15 MG PO TABS: 15.0000 mg | ORAL_TABLET | Freq: Three times a day (TID) | ORAL | Status: DC | PRN

## 2011-09-08 MED ORDER — OXYMORPHONE HCL ER 20 MG PO TB12: 20.0000 mg | ORAL_TABLET | Freq: Two times a day (BID) | ORAL | Status: DC

## 2011-09-08 NOTE — Progress Notes (Signed)
Kathy [name] appears fatigued, limping. States she has developed postop infection in foot, is on Keflex, may have to have more tendon surgery on that foot. She is asking for an increase in her strength of xanax, and also increase in dose of opana er. Will have to ask Dr Riley Kill; his last note stated she should not be increasing her pain meds. Also suggested that her foot surgeon consult w/ Dr Riley Kill re: additional meds for foot surgery.

## 2011-09-09 ENCOUNTER — Telehealth: Payer: Self-pay | Admitting: Physical Medicine & Rehabilitation

## 2011-09-09 MED ORDER — CYANOCOBALAMIN 1000 MCG/ML IJ SOLN: 1000.0000 ug | INTRAMUSCULAR | Status: DC

## 2011-09-09 MED ORDER — ALPRAZOLAM 0.5 MG PO TABS: 1.0000 mg | ORAL_TABLET | Freq: Two times a day (BID) | ORAL | Status: DC | PRN

## 2011-09-09 NOTE — Telephone Encounter (Signed)
Message copied by Ranelle Oyster on Fri Sep 09, 2011 12:20 PM ------      Message from: Leonie Douglas A      Created: Thu Sep 08, 2011  2:38 PM      Regarding: Pt med request       RN visit today. Asks if she can go back up to 1mg  strength of xanax (reduced in December  to 0.5mg  bid from 1 mg tid )            Asked if she can get a multidose vial of Vit B12 for injection, it would be more cost-effective. Also asked if we could give her syringes/needles.             I advised her to have her foot surgeon call here if she thought she needed more pain med for future foot surgery.

## 2011-09-12 ENCOUNTER — Telehealth: Payer: Self-pay | Admitting: Physical Medicine & Rehabilitation

## 2011-09-12 ENCOUNTER — Telehealth: Payer: Self-pay | Admitting: *Deleted

## 2011-09-12 NOTE — Telephone Encounter (Signed)
Dr Riley Kill increased her xanax to 1mg  bid. Rx will be phoned in to [facility] for her. Multidose vial of B12 injectable ordered. She may purchase the syringes w/ IM needle there for 18cents @. Pharmacist will let her know about the syringes when she picks up the Rx.

## 2011-09-12 NOTE — Telephone Encounter (Signed)
Had a question about insurance coverage for her xanax; Pharmacy will notify us if insurance won't cover, for Prior Auth.

## 2011-09-12 NOTE — Telephone Encounter (Signed)
Arline Asp, please call patient before calling pharmacy.

## 2011-10-05 ENCOUNTER — Encounter: Payer: Self-pay | Admitting: Physical Medicine & Rehabilitation

## 2011-10-06 ENCOUNTER — Encounter: Payer: Medicare Other | Attending: Physical Medicine & Rehabilitation | Admitting: *Deleted

## 2011-10-06 ENCOUNTER — Encounter: Payer: Self-pay | Admitting: *Deleted

## 2011-10-06 VITALS — BP 151/85 | HR 62 | Resp 18 | Ht 65.0 in | Wt 174.0 lb

## 2011-10-06 DIAGNOSIS — IMO0001 Reserved for inherently not codable concepts without codable children: Secondary | ICD-10-CM

## 2011-10-06 DIAGNOSIS — M533 Sacrococcygeal disorders, not elsewhere classified: Secondary | ICD-10-CM

## 2011-10-06 DIAGNOSIS — M5137 Other intervertebral disc degeneration, lumbosacral region: Secondary | ICD-10-CM

## 2011-10-06 MED ORDER — OXYMORPHONE HCL ER 20 MG PO TB12: 20.0000 mg | ORAL_TABLET | Freq: Two times a day (BID) | ORAL | Status: DC

## 2011-10-06 MED ORDER — ALPRAZOLAM 1 MG PO TABS: 1.0000 mg | ORAL_TABLET | Freq: Two times a day (BID) | ORAL | Status: DC

## 2011-10-06 MED ORDER — OXYCODONE HCL 15 MG PO TABS: 15.0000 mg | ORAL_TABLET | Freq: Three times a day (TID) | ORAL | Status: DC | PRN

## 2011-10-06 NOTE — Progress Notes (Signed)
States her other MD's have suggested that the rash & itching she is experiencing on her abdomen and inner thighs may be related to the abdominal mesh implanted. Still suffering from abdominal pain and bloating. On Linzess x 2 months w/o improvement. Rt foot surgical site appears well-healed, but she says it is still painful. Reports fatigue and depression over not feeling well. Reports limited mobility in neck. Is scheduled for MD visit next month to discuss these issues.

## 2011-10-07 ENCOUNTER — Encounter: Payer: Self-pay | Admitting: Physical Medicine & Rehabilitation

## 2011-11-08 ENCOUNTER — Encounter: Payer: Medicare Other | Attending: Physical Medicine & Rehabilitation | Admitting: Physical Medicine & Rehabilitation

## 2011-11-08 ENCOUNTER — Encounter: Payer: Self-pay | Admitting: Physical Medicine & Rehabilitation

## 2011-11-08 VITALS — BP 151/72 | HR 54 | Resp 14 | Ht 61.0 in | Wt 171.0 lb

## 2011-11-08 DIAGNOSIS — M47816 Spondylosis without myelopathy or radiculopathy, lumbar region: Secondary | ICD-10-CM

## 2011-11-08 DIAGNOSIS — M47817 Spondylosis without myelopathy or radiculopathy, lumbosacral region: Secondary | ICD-10-CM

## 2011-11-08 DIAGNOSIS — IMO0001 Reserved for inherently not codable concepts without codable children: Secondary | ICD-10-CM | POA: Insufficient documentation

## 2011-11-08 DIAGNOSIS — G5750 Tarsal tunnel syndrome, unspecified lower limb: Secondary | ICD-10-CM | POA: Insufficient documentation

## 2011-11-08 DIAGNOSIS — F329 Major depressive disorder, single episode, unspecified: Secondary | ICD-10-CM

## 2011-11-08 DIAGNOSIS — F3289 Other specified depressive episodes: Secondary | ICD-10-CM | POA: Insufficient documentation

## 2011-11-08 MED ORDER — PREGABALIN 150 MG PO CAPS: 150.0000 mg | ORAL_CAPSULE | Freq: Three times a day (TID) | ORAL | Status: AC

## 2011-11-08 MED ORDER — OXYCODONE HCL 15 MG PO TABS: 15.0000 mg | ORAL_TABLET | Freq: Three times a day (TID) | ORAL | Status: DC | PRN

## 2011-11-08 MED ORDER — OXYMORPHONE HCL ER 20 MG PO TB12: 20.0000 mg | ORAL_TABLET | Freq: Two times a day (BID) | ORAL | Status: DC

## 2011-11-08 NOTE — Progress Notes (Signed)
Addended by: Doreene Eland on: 11/08/2011 01:11 PM   Modules accepted: Orders

## 2011-11-08 NOTE — Patient Instructions (Signed)
You need shoes with substantial medial arch support.  You can buy an arch support to put in existing shoes if you want.

## 2011-11-08 NOTE — Progress Notes (Signed)
Subjective:    Patient ID: Kathy [name], female    DOB: [birth year], 46 y.o.   MRN: [id number]  HPI  Kathy [name] is back regarding her FMS and chronic pain. She feels that her right leg is bothering her more. Her podiatrist thought she was showing signs of sciatica.  She has moved from savella to lyrica for pain, but she's not seen any results. She was also placed on a cream for her foot which helped a bit.   She remains on her opana and oxycodone. She ran out of these last week and couldn't get back in because she was taking care of her sick mother. She tries to prop her knees up with a pillow to help with her comfort at night.   We reviewed her MRI of the L-spine from last year which only shows a tiny disc bulge at L5-S1.  She complains of tinggling and numbness over the right medial and planta aspect of the right foot. The pain is more prominent with activity and at night.  Her mood has been fairly good.  Pain Inventory Average Pain 8 Pain Right Now 9 My pain is constant, sharp, burning, stabbing and aching  In the last 24 hours, has pain interfered with the following? General activity 8 Relation with others 9 Enjoyment of life 9 What TIME of day is your pain at its worst? Daytime, Evening and Night Sleep (in general) Poor  Pain is worse with: walking, bending, inactivity and some activites Pain improves with: rest, heat/ice, medication and injections Relief from Meds: 7  Mobility walk without assistance ability to climb steps?  yes use a wheelchair transfers alone  Function I need assistance with the following:  household duties and shopping  Neuro/Psych tingling trouble walking spasms dizziness confusion depression anxiety  Prior Studies x-rays CT/MRI nerve study  Physicians involved in your care Any changes since last visit?  no  Review of Systems  Constitutional: Positive for diaphoresis, appetite change and unexpected weight change.  HENT: Negative.     Eyes: Negative.   Respiratory: Positive for shortness of breath.   Gastrointestinal: Positive for nausea, abdominal pain and constipation.  Genitourinary: Negative.   Musculoskeletal: Positive for joint swelling and gait problem.  Skin: Positive for rash.  Neurological: Positive for dizziness.  Hematological: Negative.   Psychiatric/Behavioral: Negative.        Objective:   Physical Exam  Constitutional: She is oriented to person, place, and time. She appears well-developed and well-nourished.  HENT:  Head: Normocephalic and atraumatic.  Nose: Nose normal.  Mouth/Throat: Oropharynx is clear and moist.  Eyes: EOM are normal. Pupils are equal, round, and reactive to light.  Neck: Normal range of motion.  Cardiovascular: Normal rate and regular rhythm.   Pulmonary/Chest: Effort normal and breath sounds normal. No respiratory distress.  Abdominal: Soft. She exhibits distension.  Musculoskeletal:       Low back pain with flexion and extension and with  Palpation of the mid and lower lumbar spine.  SLR is negative.  She dose have hamstring tightness.   She does pronate on the right foot with standing/weight bearing.   Neurological: She is oriented to person, place, and time. She has normal strength. No cranial nerve deficit or sensory deficit.  Reflex Scores:      Tricep reflexes are 1+ on the right side and 1+ on the left side.      Bicep reflexes are 1+ on the right side and 1+ on the left side.  Brachioradialis reflexes are 1+ on the right side and 1+ on the left side.      Patellar reflexes are 1+ on the right side and 1+ on the left side.      Achilles reflexes are 1+ on the right side and 1+ on the left side.      Positive tinels sign at right medial mallelolus  Psychiatric: She has a normal mood and affect. Her behavior is normal. Judgment and thought content normal.          Assessment & Plan:  ASSESSMENT:  1. Chronic pain syndrome/fibromyalgia.  2. Chronic  abdominal pain and low back pain.  3. Recent foot surgery for plantar fasciitis. She seems to have some  medial ankle and arch instability currently in my opinion. 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 Recommended adequate arch support shoe wear. Currently, she is  limited due to the recent toe surgery she had. Consider injection of tarsal tunnel in the future.  2. will stay with the oxycodone 15 mg 1 q.8 h  p.r.n. and Opana ER 20 mg q.12 h.  3. Will increase her lyrica to 150mg  tid to help with the TTS and FMS..  4. F/U with my PA in about one month.

## 2011-12-09 ENCOUNTER — Encounter: Payer: Self-pay | Admitting: Physical Medicine and Rehabilitation

## 2011-12-09 ENCOUNTER — Encounter: Payer: Medicare Other | Attending: Physical Medicine & Rehabilitation | Admitting: Physical Medicine and Rehabilitation

## 2011-12-09 VITALS — HR 58 | Ht 61.0 in | Wt 174.0 lb

## 2011-12-09 DIAGNOSIS — G5750 Tarsal tunnel syndrome, unspecified lower limb: Secondary | ICD-10-CM

## 2011-12-09 DIAGNOSIS — M797 Fibromyalgia: Secondary | ICD-10-CM

## 2011-12-09 DIAGNOSIS — M79609 Pain in unspecified limb: Secondary | ICD-10-CM | POA: Insufficient documentation

## 2011-12-09 DIAGNOSIS — F341 Dysthymic disorder: Secondary | ICD-10-CM | POA: Insufficient documentation

## 2011-12-09 DIAGNOSIS — M79671 Pain in right foot: Secondary | ICD-10-CM

## 2011-12-09 DIAGNOSIS — G8929 Other chronic pain: Secondary | ICD-10-CM | POA: Insufficient documentation

## 2011-12-09 DIAGNOSIS — M47816 Spondylosis without myelopathy or radiculopathy, lumbar region: Secondary | ICD-10-CM

## 2011-12-09 DIAGNOSIS — M47817 Spondylosis without myelopathy or radiculopathy, lumbosacral region: Secondary | ICD-10-CM

## 2011-12-09 DIAGNOSIS — F329 Major depressive disorder, single episode, unspecified: Secondary | ICD-10-CM

## 2011-12-09 DIAGNOSIS — M545 Low back pain, unspecified: Secondary | ICD-10-CM | POA: Insufficient documentation

## 2011-12-09 DIAGNOSIS — IMO0001 Reserved for inherently not codable concepts without codable children: Secondary | ICD-10-CM | POA: Insufficient documentation

## 2011-12-09 DIAGNOSIS — Q762 Congenital spondylolisthesis: Secondary | ICD-10-CM | POA: Insufficient documentation

## 2011-12-09 DIAGNOSIS — M2550 Pain in unspecified joint: Secondary | ICD-10-CM | POA: Insufficient documentation

## 2011-12-09 MED ORDER — OXYMORPHONE HCL ER 20 MG PO TB12: 20.0000 mg | ORAL_TABLET | Freq: Two times a day (BID) | ORAL | Status: DC

## 2011-12-09 MED ORDER — OXYCODONE HCL 15 MG PO TABS: 15.0000 mg | ORAL_TABLET | Freq: Three times a day (TID) | ORAL | Status: DC | PRN

## 2011-12-09 NOTE — Progress Notes (Signed)
Subjective:    Patient ID: Kathy [name], female    DOB: 1966/01/12, 46 y.o.   MRN: [id number]  HPI The patient complains about chronic low back pain . The patient denies any radiation. The patient also complains about pain in her right foot, and in her right achilles tendon. The problem has been stable .  Pain Inventory Average Pain 8 Pain Right Now 10 My pain is constant, sharp, burning, stabbing, tingling and aching  In the last 24 hours, has pain interfered with the following? General activity 9 Relation with others 9 Enjoyment of life 10 What TIME of day is your pain at its worst? morning and night Sleep (in general) Poor  Pain is worse with: walking, bending, inactivity and some activites Pain improves with: medication Relief from Meds: 8  Mobility walk without assistance ability to climb steps?  yes do you drive?  yes transfers alone  Function disabled: date disabled  I need assistance with the following:  household duties Do you have any goals in this area?  yes  Neuro/Psych numbness tingling dizziness confusion anxiety  Prior Studies Any changes since last visit?  no  Physicians involved in your care Any changes since last visit?  no   Family History  Problem Relation Age of Onset  . Heart disease Mother   . Diabetes Mother   . Hypertension Mother   . Heart disease Brother   . Diabetes Brother   . Hypertension Brother   . Heart disease Brother   . Diabetes Brother   . Hypertension Brother    History   Social History  . Marital Status: Single    Spouse Name: N/A    Number of Children: N/A  . Years of Education: N/A   Social History Main Topics  . Smoking status: Never Smoker   . Smokeless tobacco: None  . Alcohol Use: No  . Drug Use: No  . Sexually Active: None   Other Topics Concern  . None   Social History Narrative  . None   Past Surgical History  Procedure Date  . Hernia repair   . Cholecystectomy   . Abdominal  hysterectomy   . Foot surgery   . Carpal tunnel release    Past Medical History  Diagnosis Date  . Degeneration of lumbar or lumbosacral intervertebral disc   . Enthesopathy of hip region   . Unequal leg length (acquired)   . Disorders of sacrum   . Facet syndrome, lumbar   . Myalgia and myositis, unspecified    Pulse 58  Ht 5\' 1"  (1.549 m)  Wt 174 lb (78.926 kg)  BMI 32.88 kg/m2  SpO2 99%     Review of Systems  Constitutional: Positive for unexpected weight change.  Gastrointestinal: Positive for nausea.  All other systems reviewed and are negative.       Objective:   Physical Exam  Constitutional: She is oriented to person, place, and time. She appears well-developed and well-nourished.  HENT:  Head: Normocephalic.  Neck: Neck supple.  Musculoskeletal: She exhibits tenderness.  Neurological: She is alert and oriented to person, place, and time.  Skin: Skin is warm and dry.  Psychiatric: She has a normal mood and affect.    Symmetric normal motor tone is noted throughout. Normal muscle bulk. Muscle testing reveals 5/5 muscle strength of the upper extremity, and 5/5 of the lower extremity. Full range of motion in upper and lower extremities. ROM of spine is mildly restricted.   DTR in  the upper and lower extremity are present and symmetric 2+. No clonus is noted.  Patient arises from chair without difficulty. Narrow based gait with normal arm swing bilateral , able to stand on heels and toes . Tandem walk is stable.  Tenderness on right paraspinal muscles in L-spine, at her right achilles tendon, but no swelling in this area, and her right foot.         Assessment & Plan:  This is 46 year old female with 1. Fibromyalgia 2. Lumbar spondylolisthesis 3.Polyarthralgia 4. Anxiety and depression  5. right foot pain, and right achilles tendon pain Plan : Continue with your medication. Showed patient some exercises stretches and massage techniques for her right for  her right achilles tendon, and right foot pain. Discuss with patient that she should do some core strengthening exercises, and that walking or exercises in the pool  would be very beneficial for her. Follow up in 1 month.

## 2011-12-09 NOTE — Patient Instructions (Signed)
Continue with PT, continue with medication. Advised patient to walk in the pool.

## 2012-01-09 ENCOUNTER — Encounter: Payer: Self-pay | Admitting: Physical Medicine and Rehabilitation

## 2012-01-09 ENCOUNTER — Encounter: Payer: Medicare Other | Attending: Physical Medicine & Rehabilitation | Admitting: Physical Medicine and Rehabilitation

## 2012-01-09 VITALS — BP 129/93 | HR 84 | Resp 16 | Ht 61.0 in | Wt 176.2 lb

## 2012-01-09 DIAGNOSIS — M545 Low back pain, unspecified: Secondary | ICD-10-CM | POA: Insufficient documentation

## 2012-01-09 DIAGNOSIS — M797 Fibromyalgia: Secondary | ICD-10-CM

## 2012-01-09 DIAGNOSIS — G8929 Other chronic pain: Secondary | ICD-10-CM | POA: Insufficient documentation

## 2012-01-09 DIAGNOSIS — F341 Dysthymic disorder: Secondary | ICD-10-CM | POA: Insufficient documentation

## 2012-01-09 DIAGNOSIS — IMO0001 Reserved for inherently not codable concepts without codable children: Secondary | ICD-10-CM | POA: Insufficient documentation

## 2012-01-09 DIAGNOSIS — F329 Major depressive disorder, single episode, unspecified: Secondary | ICD-10-CM

## 2012-01-09 DIAGNOSIS — M2550 Pain in unspecified joint: Secondary | ICD-10-CM | POA: Insufficient documentation

## 2012-01-09 DIAGNOSIS — M47816 Spondylosis without myelopathy or radiculopathy, lumbar region: Secondary | ICD-10-CM

## 2012-01-09 DIAGNOSIS — M47817 Spondylosis without myelopathy or radiculopathy, lumbosacral region: Secondary | ICD-10-CM

## 2012-01-09 DIAGNOSIS — G5750 Tarsal tunnel syndrome, unspecified lower limb: Secondary | ICD-10-CM

## 2012-01-09 DIAGNOSIS — M79609 Pain in unspecified limb: Secondary | ICD-10-CM | POA: Insufficient documentation

## 2012-01-09 MED ORDER — OXYMORPHONE HCL ER 20 MG PO TB12: 20.0000 mg | ORAL_TABLET | Freq: Two times a day (BID) | ORAL | Status: DC

## 2012-01-09 MED ORDER — OXYCODONE HCL 15 MG PO TABS: 15.0000 mg | ORAL_TABLET | Freq: Three times a day (TID) | ORAL | Status: DC | PRN

## 2012-01-09 NOTE — Progress Notes (Signed)
Subjective:    Patient ID: Kathy [name], female    DOB: [birth year], 46 y.o.   MRN: [id number]  HPI The patient complains about chronic low back pain . The patient denies any radiation. The patient also complains about pain in her right foot, and in her right achilles tendon.  The problem has been stable . The patient reports that the Opana tablets she got at the last visit do not dissolve that well. These were round tablets before she received  8- sided tablets, which dissolve completely. She would like to get the 8- sided tablets prescribed.  Pain Inventory Average Pain 9 Pain Right Now 10 My pain is constant, sharp, burning, stabbing and aching  In the last 24 hours, has pain interfered with the following? General activity 10 Relation with others 10 Enjoyment of life 10 What TIME of day is your pain at its worst? morning evening and night Sleep (in general) Poor  Pain is worse with: walking, bending, sitting, inactivity, standing and some activites Pain improves with: rest and medication Relief from Meds: 8  Mobility walk without assistance ability to climb steps?  yes do you drive?  yes transfers alone  Function disabled: date disabled  I need assistance with the following:  household duties and shopping  Neuro/Psych numbness tingling anxiety loss of taste or smell  Prior Studies Any changes since last visit?  no  Physicians involved in your care Any changes since last visit?  no   Family History  Problem Relation Age of Onset  . Heart disease Mother   . Diabetes Mother   . Hypertension Mother   . Heart disease Brother   . Diabetes Brother   . Hypertension Brother   . Heart disease Brother   . Diabetes Brother   . Hypertension Brother    History   Social History  . Marital Status: Single    Spouse Name: N/A    Number of Children: N/A  . Years of Education: N/A   Social History Main Topics  . Smoking status: Never Smoker   . Smokeless tobacco:  Never Used  . Alcohol Use: No  . Drug Use: No  . Sexually Active: None   Other Topics Concern  . None   Social History Narrative  . None   Past Surgical History  Procedure Date  . Hernia repair   . Cholecystectomy   . Abdominal hysterectomy   . Foot surgery   . Carpal tunnel release    Past Medical History  Diagnosis Date  . Degeneration of lumbar or lumbosacral intervertebral disc   . Enthesopathy of hip region   . Unequal leg length (acquired)   . Disorders of sacrum   . Facet syndrome, lumbar   . Myalgia and myositis, unspecified    BP 129/93  Pulse 84  Resp 16  Ht 5\' 1"  (1.549 m)  Wt 176 lb 3.2 oz (79.924 kg)  BMI 33.29 kg/m2  SpO2 100%    Review of Systems  Constitutional: Positive for diaphoresis, appetite change and unexpected weight change.       Loss of taste or smell  Cardiovascular: Positive for leg swelling.  Gastrointestinal: Positive for nausea and abdominal pain.  Musculoskeletal: Positive for back pain and gait problem.  Skin: Positive for rash.  Neurological: Positive for numbness.       Tingling  All other systems reviewed and are negative.       Objective:   Physical Exam Constitutional: She is oriented to  person, place, and time. She appears well-developed and well-nourished.  HENT:  Head: Normocephalic.  Neck: Neck supple.  Musculoskeletal: She exhibits tenderness.  Neurological: She is alert and oriented to person, place, and time.  Skin: Skin is warm and dry.  Psychiatric: She has a normal mood and affect.   Symmetric normal motor tone is noted throughout. Normal muscle bulk. Muscle testing reveals 5/5 muscle strength of the upper extremity, and 5/5 of the lower extremity. Full range of motion in upper and lower extremities. ROM of spine is mildly restricted.  DTR in the upper and lower extremity are present and symmetric 2+. No clonus is noted.  Patient arises from chair without difficulty. Narrow based gait with normal arm  swing bilateral , able to stand on heels and toes . Tandem walk is stable.  Tenderness on right paraspinal muscles in L-spine, at her right achilles tendon, but no swelling in this area, and her right foot        Assessment & Plan:  This is 46 year old female with  1. Fibromyalgia  2. Lumbar spondylolisthesis  3.Polyarthralgia  4. Anxiety and depression  5. right foot pain, and right achilles tendon pain  Plan :  Continue with your medication. Refilled 8-sided tablets of Opana. Also refilled her Oxycodone. Showed patient some exercises stretches and massage techniques for her right for her right achilles tendon, and right foot pain. Discuss with patient that she should do some core strengthening exercises, and that walking or exercises in the pool would be very beneficial for her.  Follow up in 1 month.

## 2012-01-09 NOTE — Patient Instructions (Addendum)
Continue with exercising, continue with PT. Try to exercise or walk in the pool.

## 2012-01-10 ENCOUNTER — Telehealth: Payer: Self-pay | Admitting: Physical Medicine & Rehabilitation

## 2012-01-10 NOTE — Telephone Encounter (Signed)
Pharmacy needed to make sure it was ok to change from one manufacturer to another.  Ok with pt.  Oked.

## 2012-01-10 NOTE — Telephone Encounter (Signed)
Please call re Rx

## 2012-02-09 ENCOUNTER — Encounter
Payer: Medicare Other | Attending: Physical Medicine and Rehabilitation | Admitting: Physical Medicine and Rehabilitation

## 2012-02-09 ENCOUNTER — Encounter: Payer: Self-pay | Admitting: Physical Medicine and Rehabilitation

## 2012-02-09 VITALS — BP 128/65 | HR 58 | Resp 14 | Ht 61.0 in | Wt 175.4 lb

## 2012-02-09 DIAGNOSIS — M545 Low back pain, unspecified: Secondary | ICD-10-CM | POA: Insufficient documentation

## 2012-02-09 DIAGNOSIS — G8929 Other chronic pain: Secondary | ICD-10-CM | POA: Insufficient documentation

## 2012-02-09 DIAGNOSIS — G5750 Tarsal tunnel syndrome, unspecified lower limb: Secondary | ICD-10-CM

## 2012-02-09 DIAGNOSIS — F329 Major depressive disorder, single episode, unspecified: Secondary | ICD-10-CM | POA: Insufficient documentation

## 2012-02-09 DIAGNOSIS — Q762 Congenital spondylolisthesis: Secondary | ICD-10-CM | POA: Insufficient documentation

## 2012-02-09 DIAGNOSIS — F3289 Other specified depressive episodes: Secondary | ICD-10-CM | POA: Insufficient documentation

## 2012-02-09 DIAGNOSIS — F411 Generalized anxiety disorder: Secondary | ICD-10-CM | POA: Insufficient documentation

## 2012-02-09 DIAGNOSIS — M47817 Spondylosis without myelopathy or radiculopathy, lumbosacral region: Secondary | ICD-10-CM

## 2012-02-09 DIAGNOSIS — M47816 Spondylosis without myelopathy or radiculopathy, lumbar region: Secondary | ICD-10-CM

## 2012-02-09 DIAGNOSIS — M79609 Pain in unspecified limb: Secondary | ICD-10-CM | POA: Insufficient documentation

## 2012-02-09 DIAGNOSIS — M797 Fibromyalgia: Secondary | ICD-10-CM

## 2012-02-09 DIAGNOSIS — M2550 Pain in unspecified joint: Secondary | ICD-10-CM | POA: Insufficient documentation

## 2012-02-09 DIAGNOSIS — IMO0001 Reserved for inherently not codable concepts without codable children: Secondary | ICD-10-CM

## 2012-02-09 MED ORDER — OXYCODONE HCL 15 MG PO TABS: 15.0000 mg | ORAL_TABLET | Freq: Three times a day (TID) | ORAL | Status: DC | PRN

## 2012-02-09 MED ORDER — OXYMORPHONE HCL ER 20 MG PO TB12: 20.0000 mg | ORAL_TABLET | Freq: Two times a day (BID) | ORAL | Status: DC

## 2012-02-09 MED ORDER — ALPRAZOLAM 1 MG PO TABS: 1.0000 mg | ORAL_TABLET | Freq: Two times a day (BID) | ORAL | Status: DC

## 2012-02-09 MED ORDER — METHOCARBAMOL 500 MG PO TABS: 500.0000 mg | ORAL_TABLET | Freq: Four times a day (QID) | ORAL | Status: AC

## 2012-02-09 NOTE — Patient Instructions (Signed)
Try to get back into exercising and walking in the pool.

## 2012-02-09 NOTE — Progress Notes (Signed)
Subjective:    Patient ID: Kathy [name], female    DOB: [birth year], 46 y.o.   MRN: [id number]  HPI The patient complains about chronic low back pain . The patient denies any radiation. The patient also complains about pain in her right foot, and in her right achilles tendon. She also complains about tide neck muscles, which started a couple days ago, when she also started to have to throw up a lot.  The problem has been stable .  Pain Inventory Average Pain 9 Pain Right Now 9 My pain is constant, sharp, stabbing and aching  In the last 24 hours, has pain interfered with the following? General activity 9 Relation with others 9 Enjoyment of life 9 What TIME of day is your pain at its worst? daytime and night Sleep (in general) Poor  Pain is worse with: sitting and some activites Pain improves with: medication Relief from Meds: 5  Mobility walk without assistance ability to climb steps?  yes do you drive?  yes  Function disabled: date disabled  I need assistance with the following:  household duties and shopping  Neuro/Psych trouble walking spasms dizziness depression anxiety  Prior Studies Any changes since last visit?  no  Physicians involved in your care Any changes since last visit?  no   Family History  Problem Relation Age of Onset  . Heart disease Mother   . Diabetes Mother   . Hypertension Mother   . Heart disease Brother   . Diabetes Brother   . Hypertension Brother   . Heart disease Brother   . Diabetes Brother   . Hypertension Brother    History   Social History  . Marital Status: Single    Spouse Name: N/A    Number of Children: N/A  . Years of Education: N/A   Social History Main Topics  . Smoking status: Never Smoker   . Smokeless tobacco: Never Used  . Alcohol Use: No  . Drug Use: No  . Sexually Active: None   Other Topics Concern  . None   Social History Narrative  . None   Past Surgical History  Procedure Date  .  Hernia repair   . Cholecystectomy   . Abdominal hysterectomy   . Foot surgery   . Carpal tunnel release    Past Medical History  Diagnosis Date  . Degeneration of lumbar or lumbosacral intervertebral disc   . Enthesopathy of hip region   . Unequal leg length (acquired)   . Disorders of sacrum   . Facet syndrome, lumbar   . Myalgia and myositis, unspecified    BP 128/65  Pulse 58  Resp 14  Ht 5\' 1"  (1.549 m)  Wt 175 lb 6.4 oz (79.561 kg)  BMI 33.14 kg/m2  SpO2 92%    Review of Systems  Constitutional: Positive for diaphoresis and unexpected weight change.  HENT: Positive for neck pain.   Gastrointestinal: Positive for nausea, vomiting, abdominal pain and diarrhea.  Musculoskeletal: Positive for back pain and gait problem.       Spasms  Skin: Positive for rash.  Neurological: Positive for dizziness.  Psychiatric/Behavioral: Positive for dysphoric mood. The patient is nervous/anxious.   All other systems reviewed and are negative.       Objective:   Physical Exam Constitutional: She is oriented to person, place, and time. She appears well-developed and well-nourished.  HENT:  Head: Normocephalic.  Neck: Neck supple.  Musculoskeletal: She exhibits tenderness.  Neurological: She is alert and  oriented to person, place, and time.  Skin: Skin is warm and dry.  Psychiatric: She has a normal mood and affect.  Symmetric normal motor tone is noted throughout. Normal muscle bulk. Muscle testing reveals 5/5 muscle strength of the upper extremity, and 5/5 of the lower extremity. Full range of motion in upper and lower extremities. ROM of spine is mildly restricted.  DTR in the upper and lower extremity are present and symmetric 2+. No clonus is noted.  Patient arises from chair without difficulty. Narrow based gait with normal arm swing bilateral , able to stand on heels and toes . Tandem walk is stable.  Tenderness on right paraspinal muscles in L-spine, at her right achilles  tendon, but no swelling in this area, and her right foot        Assessment & Plan:  This is 46 year old female with  1. Fibromyalgia  2. Lumbar spondylolisthesis  3.Polyarthralgia  4. Anxiety and depression  5. right foot pain, and right achilles tendon pain  Plan :  Continue with your medication. Showed patient some exercises stretches and massage techniques for her right for her right achilles tendon, and right foot pain. Discuss with patient that she should do some core strengthening exercises, and that walking or exercises in the pool would be very beneficial for her.Advised patient to apply Voltaren gel on the inflamed Achilles tendon. Prescribed Robaxin for her tide neck muscles. Refilled her pain medication.Oxy 15mg  tid, Opana 20mg  bid.  Follow up in 1 month.

## 2012-02-14 ENCOUNTER — Ambulatory Visit: Payer: Medicare Other | Admitting: Physical Medicine and Rehabilitation

## 2012-03-09 ENCOUNTER — Encounter
Payer: Medicare Other | Attending: Physical Medicine and Rehabilitation | Admitting: Physical Medicine and Rehabilitation

## 2012-03-09 ENCOUNTER — Encounter: Payer: Self-pay | Admitting: Physical Medicine and Rehabilitation

## 2012-03-09 VITALS — BP 153/90 | HR 56 | Resp 16 | Ht 65.0 in | Wt 180.0 lb

## 2012-03-09 DIAGNOSIS — F329 Major depressive disorder, single episode, unspecified: Secondary | ICD-10-CM

## 2012-03-09 DIAGNOSIS — M2550 Pain in unspecified joint: Secondary | ICD-10-CM | POA: Insufficient documentation

## 2012-03-09 DIAGNOSIS — M545 Low back pain, unspecified: Secondary | ICD-10-CM | POA: Insufficient documentation

## 2012-03-09 DIAGNOSIS — F3289 Other specified depressive episodes: Secondary | ICD-10-CM | POA: Insufficient documentation

## 2012-03-09 DIAGNOSIS — F411 Generalized anxiety disorder: Secondary | ICD-10-CM | POA: Insufficient documentation

## 2012-03-09 DIAGNOSIS — M79609 Pain in unspecified limb: Secondary | ICD-10-CM | POA: Insufficient documentation

## 2012-03-09 DIAGNOSIS — M797 Fibromyalgia: Secondary | ICD-10-CM

## 2012-03-09 DIAGNOSIS — G5750 Tarsal tunnel syndrome, unspecified lower limb: Secondary | ICD-10-CM

## 2012-03-09 DIAGNOSIS — M47816 Spondylosis without myelopathy or radiculopathy, lumbar region: Secondary | ICD-10-CM

## 2012-03-09 DIAGNOSIS — Q762 Congenital spondylolisthesis: Secondary | ICD-10-CM | POA: Insufficient documentation

## 2012-03-09 DIAGNOSIS — R1110 Vomiting, unspecified: Secondary | ICD-10-CM | POA: Insufficient documentation

## 2012-03-09 DIAGNOSIS — IMO0001 Reserved for inherently not codable concepts without codable children: Secondary | ICD-10-CM

## 2012-03-09 DIAGNOSIS — M47817 Spondylosis without myelopathy or radiculopathy, lumbosacral region: Secondary | ICD-10-CM

## 2012-03-09 MED ORDER — OXYMORPHONE HCL ER 20 MG PO TB12: 20.0000 mg | ORAL_TABLET | Freq: Two times a day (BID) | ORAL | Status: DC

## 2012-03-09 MED ORDER — OXYCODONE HCL 15 MG PO TABS: 15.0000 mg | ORAL_TABLET | Freq: Three times a day (TID) | ORAL | Status: DC | PRN

## 2012-03-09 NOTE — Patient Instructions (Addendum)
Continue with walking program and exercising. Try to look into water aerobics with your mom.

## 2012-03-09 NOTE — Progress Notes (Signed)
Subjective:    Patient ID: Kathy [name], female    DOB: 1965/11/23, 46 y.o.   MRN: [id number]  HPI The patient complains about chronic low back pain . The patient denies any radiation. The patient also complains about pain in her right foot, and in her right achilles tendon. She reports, that she still throws up a lot, this started  5 weeks ago, she is waiting to get an appointment at her GI doctor.  Otherwise the problem has been stable .  Pain Inventory Average Pain 8 Pain Right Now 8 My pain is constant, sharp, burning, dull, stabbing and aching  In the last 24 hours, has pain interfered with the following? General activity 9 Relation with others 7 Enjoyment of life 9 What TIME of day is your pain at its worst? daytime Sleep (in general) Poor  Pain is worse with: walking, inactivity and some activites Pain improves with: rest, medication and injections Relief from Meds: 9  Mobility walk without assistance how many minutes can you walk? 10-15 ability to climb steps?  yes do you drive?  yes  Function disabled: date disabled  I need assistance with the following:  household duties and shopping  Neuro/Psych bowel control problems numbness tingling dizziness anxiety  Prior Studies Any changes since last visit?  no  Physicians involved in your care Any changes since last visit?  no   Family History  Problem Relation Age of Onset  . Heart disease Mother   . Diabetes Mother   . Hypertension Mother   . Heart disease Brother   . Diabetes Brother   . Hypertension Brother   . Heart disease Brother   . Diabetes Brother   . Hypertension Brother    History   Social History  . Marital Status: Single    Spouse Name: N/A    Number of Children: N/A  . Years of Education: N/A   Social History Main Topics  . Smoking status: Never Smoker   . Smokeless tobacco: Never Used  . Alcohol Use: No  . Drug Use: No  . Sexually Active: None   Other Topics Concern  .  None   Social History Narrative  . None   Past Surgical History  Procedure Date  . Hernia repair   . Cholecystectomy   . Abdominal hysterectomy   . Foot surgery   . Carpal tunnel release    Past Medical History  Diagnosis Date  . Degeneration of lumbar or lumbosacral intervertebral disc   . Enthesopathy of hip region   . Unequal leg length (acquired)   . Disorders of sacrum   . Facet syndrome, lumbar   . Myalgia and myositis, unspecified    BP 153/90  Pulse 56  Resp 16  Ht 5\' 5"  (1.651 m)  Wt 180 lb (81.647 kg)  BMI 29.95 kg/m2  SpO2 99%      Review of Systems  Constitutional: Positive for diaphoresis and unexpected weight change.  HENT: Negative.   Eyes: Negative.   Respiratory: Negative.   Cardiovascular: Negative.   Gastrointestinal: Positive for nausea, vomiting and abdominal pain.  Genitourinary: Negative.   Musculoskeletal: Positive for back pain.  Skin: Positive for rash.  Neurological: Positive for tremors and numbness.  Hematological: Negative.   Psychiatric/Behavioral: Negative.        Objective:   Physical Exam Constitutional: She is oriented to person, place, and time. She appears well-developed and well-nourished.  HENT:  Head: Normocephalic.  Neck: Neck supple.  Musculoskeletal: She  exhibits tenderness.  Neurological: She is alert and oriented to person, place, and time.  Skin: Skin is warm and dry.  Psychiatric: She has a normal mood and affect.  Symmetric normal motor tone is noted throughout. Normal muscle bulk. Muscle testing reveals 5/5 muscle strength of the upper extremity, and 5/5 of the lower extremity. Full range of motion in upper and lower extremities. ROM of spine is mildly restricted.  DTR in the upper and lower extremity are present and symmetric 2+. No clonus is noted.  Patient arises from chair without difficulty. Narrow based gait with normal arm swing bilateral , able to stand on heels and toes . Tandem walk is stable.    Tenderness on right paraspinal muscles in L-spine, at her right achilles tendon, but no swelling in this area, and her right foot        Assessment & Plan:  This is 46 year old female with  1. Fibromyalgia  2. Lumbar spondylolisthesis  3.Polyarthralgia  4. Anxiety and depression  5. right foot pain, and right achilles tendon pain  Plan :  Continue with your medication. Showed patient some exercises stretches and massage techniques for her right for her right achilles tendon, and right foot pain. Discuss with patient that she should do some core strengthening exercises, and that walking or exercises in the pool would be very beneficial for her.Advised patient to apply Voltaren gel on the inflamed Achilles tendon.  Refilled her pain medication.Oxy 15mg  tid, Opana 20mg  bid.  Follow up in 1 month.

## 2012-04-05 ENCOUNTER — Encounter: Payer: Self-pay | Admitting: Physical Medicine and Rehabilitation

## 2012-04-05 ENCOUNTER — Encounter
Payer: Medicare Other | Attending: Physical Medicine and Rehabilitation | Admitting: Physical Medicine and Rehabilitation

## 2012-04-05 VITALS — BP 141/69 | HR 78 | Resp 16 | Ht 60.0 in | Wt 181.0 lb

## 2012-04-05 DIAGNOSIS — M79609 Pain in unspecified limb: Secondary | ICD-10-CM | POA: Insufficient documentation

## 2012-04-05 DIAGNOSIS — G8929 Other chronic pain: Secondary | ICD-10-CM | POA: Insufficient documentation

## 2012-04-05 DIAGNOSIS — G5750 Tarsal tunnel syndrome, unspecified lower limb: Secondary | ICD-10-CM

## 2012-04-05 DIAGNOSIS — M545 Low back pain, unspecified: Secondary | ICD-10-CM | POA: Insufficient documentation

## 2012-04-05 DIAGNOSIS — F3289 Other specified depressive episodes: Secondary | ICD-10-CM | POA: Insufficient documentation

## 2012-04-05 DIAGNOSIS — IMO0001 Reserved for inherently not codable concepts without codable children: Secondary | ICD-10-CM

## 2012-04-05 DIAGNOSIS — M47816 Spondylosis without myelopathy or radiculopathy, lumbar region: Secondary | ICD-10-CM

## 2012-04-05 DIAGNOSIS — F411 Generalized anxiety disorder: Secondary | ICD-10-CM | POA: Insufficient documentation

## 2012-04-05 DIAGNOSIS — M2550 Pain in unspecified joint: Secondary | ICD-10-CM | POA: Insufficient documentation

## 2012-04-05 DIAGNOSIS — M47817 Spondylosis without myelopathy or radiculopathy, lumbosacral region: Secondary | ICD-10-CM | POA: Insufficient documentation

## 2012-04-05 DIAGNOSIS — F329 Major depressive disorder, single episode, unspecified: Secondary | ICD-10-CM | POA: Insufficient documentation

## 2012-04-05 DIAGNOSIS — M797 Fibromyalgia: Secondary | ICD-10-CM

## 2012-04-05 MED ORDER — OXYCODONE HCL 15 MG PO TABS: 15.0000 mg | ORAL_TABLET | Freq: Three times a day (TID) | ORAL | Status: DC | PRN

## 2012-04-05 MED ORDER — OXYMORPHONE HCL ER 20 MG PO TB12: 20.0000 mg | ORAL_TABLET | Freq: Two times a day (BID) | ORAL | Status: DC

## 2012-04-05 NOTE — Patient Instructions (Signed)
Bring your pill bottles to your visit every time, please

## 2012-04-05 NOTE — Progress Notes (Signed)
Subjective:    Patient ID: Kathy [name], female    DOB: June 10, 1966, 46 y.o.   MRN: [id number]  HPI The patient complains about chronic low back pain . The patient denies any radiation. The patient also complains about pain in her right foot, and in her right achilles tendon. She reports, that she has seen her GI doctor, who might consider another surgery.  Otherwise the problem has been stable . The patient did not bring her pill bottles again, today, therefore she has to show them to a nurse, before I can give her her prescription.  Pain Inventory Average Pain 8 Pain Right Now 9 My pain is constant, sharp, burning, dull, tingling and aching  In the last 24 hours, has pain interfered with the following? General activity 10 Relation with others 10 Enjoyment of life 9 What TIME of day is your pain at its worst? All Day Sleep (in general) Poor  Pain is worse with: walking, bending, sitting, inactivity and standing Pain improves with: rest, medication and injections Relief from Meds: 8  Mobility walk without assistance how many minutes can you walk? 10-15 ability to climb steps?  yes do you drive?  yes  Function disabled: date disabled  I need assistance with the following:  dressing, bathing, household duties and shopping  Neuro/Psych bladder control problems bowel control problems weakness numbness dizziness anxiety loss of taste or smell  Prior Studies Any changes since last visit?  no  Physicians involved in your care Any changes since last visit?  no   Family History  Problem Relation Age of Onset  . Heart disease Mother   . Diabetes Mother   . Hypertension Mother   . Heart disease Brother   . Diabetes Brother   . Hypertension Brother   . Heart disease Brother   . Diabetes Brother   . Hypertension Brother    History   Social History  . Marital Status: Single    Spouse Name: N/A    Number of Children: N/A  . Years of Education: N/A   Social  History Main Topics  . Smoking status: Never Smoker   . Smokeless tobacco: Never Used  . Alcohol Use: No  . Drug Use: No  . Sexually Active: None   Other Topics Concern  . None   Social History Narrative  . None   Past Surgical History  Procedure Date  . Hernia repair   . Cholecystectomy   . Abdominal hysterectomy   . Foot surgery   . Carpal tunnel release    Past Medical History  Diagnosis Date  . Degeneration of lumbar or lumbosacral intervertebral disc   . Enthesopathy of hip region   . Unequal leg length (acquired)   . Disorders of sacrum   . Facet syndrome, lumbar   . Myalgia and myositis, unspecified    ,    Review of Systems  Constitutional: Positive for fever, chills, diaphoresis, appetite change and unexpected weight change.  HENT: Negative.   Eyes: Negative.   Respiratory: Negative.   Cardiovascular: Positive for leg swelling.  Gastrointestinal: Positive for nausea, abdominal pain, diarrhea and constipation.  Genitourinary: Positive for urgency.  Musculoskeletal: Positive for myalgias and arthralgias.  Skin: Positive for pallor.  Neurological: Positive for dizziness, weakness and numbness.  Hematological: Negative.   Psychiatric/Behavioral: Negative.        Objective:   Physical Exam Constitutional: She is oriented to person, place, and time. She appears well-developed and well-nourished.  HENT:  Head:  Normocephalic.  Neck: Neck supple.  Musculoskeletal: She exhibits tenderness.  Neurological: She is alert and oriented to person, place, and time.  Skin: Skin is warm and dry.  Psychiatric: She has a normal mood and affect.  Symmetric normal motor tone is noted throughout. Normal muscle bulk. Muscle testing reveals 5/5 muscle strength of the upper extremity, and 5/5 of the lower extremity. Full range of motion in upper and lower extremities. ROM of spine is mildly restricted.  DTR in the upper and lower extremity are present and symmetric 2+. No  clonus is noted.  Patient arises from chair without difficulty. Narrow based gait with normal arm swing bilateral , able to stand on heels and toes . Tandem walk is stable.  Tenderness on right paraspinal muscles in L-spine, at her right achilles tendon, but no swelling in this area, and her right foot        Assessment & Plan:  This is 46 year old female with  1. Fibromyalgia  2. Lumbar spondylolisthesis  3.Polyarthralgia  4. Anxiety and depression  5. right foot pain, and right achilles tendon pain  Plan :  Continue with your medication. Showed patient some exercises stretches and massage techniques for her right for her right achilles tendon, and right foot pain. Discuss with patient that she should do some core strengthening exercises, and that walking or exercises in the pool would be very beneficial for her.Advised patient to apply Voltaren gel on the inflamed Achilles tendon. The patient did not bring her pill bottles again, today, therefore she has to show them to a nurse, before I can give her her prescription.  Follow up in 1 month.

## 2012-05-03 ENCOUNTER — Encounter
Payer: Medicare Other | Attending: Physical Medicine and Rehabilitation | Admitting: Physical Medicine and Rehabilitation

## 2012-05-03 ENCOUNTER — Encounter: Payer: Self-pay | Admitting: Physical Medicine and Rehabilitation

## 2012-05-03 VITALS — BP 152/80 | HR 62 | Resp 14 | Ht 60.0 in | Wt 174.2 lb

## 2012-05-03 DIAGNOSIS — M542 Cervicalgia: Secondary | ICD-10-CM

## 2012-05-03 DIAGNOSIS — M47816 Spondylosis without myelopathy or radiculopathy, lumbar region: Secondary | ICD-10-CM

## 2012-05-03 DIAGNOSIS — M545 Low back pain, unspecified: Secondary | ICD-10-CM | POA: Insufficient documentation

## 2012-05-03 DIAGNOSIS — G5750 Tarsal tunnel syndrome, unspecified lower limb: Secondary | ICD-10-CM

## 2012-05-03 DIAGNOSIS — M2550 Pain in unspecified joint: Secondary | ICD-10-CM | POA: Insufficient documentation

## 2012-05-03 DIAGNOSIS — G8929 Other chronic pain: Secondary | ICD-10-CM | POA: Insufficient documentation

## 2012-05-03 DIAGNOSIS — M47817 Spondylosis without myelopathy or radiculopathy, lumbosacral region: Secondary | ICD-10-CM

## 2012-05-03 DIAGNOSIS — F3289 Other specified depressive episodes: Secondary | ICD-10-CM | POA: Insufficient documentation

## 2012-05-03 DIAGNOSIS — F411 Generalized anxiety disorder: Secondary | ICD-10-CM | POA: Insufficient documentation

## 2012-05-03 DIAGNOSIS — F329 Major depressive disorder, single episode, unspecified: Secondary | ICD-10-CM | POA: Insufficient documentation

## 2012-05-03 DIAGNOSIS — IMO0001 Reserved for inherently not codable concepts without codable children: Secondary | ICD-10-CM | POA: Insufficient documentation

## 2012-05-03 DIAGNOSIS — M79609 Pain in unspecified limb: Secondary | ICD-10-CM | POA: Insufficient documentation

## 2012-05-03 DIAGNOSIS — M79671 Pain in right foot: Secondary | ICD-10-CM

## 2012-05-03 DIAGNOSIS — M47812 Spondylosis without myelopathy or radiculopathy, cervical region: Secondary | ICD-10-CM | POA: Insufficient documentation

## 2012-05-03 MED ORDER — OXYCODONE HCL 15 MG PO TABS: 15.0000 mg | ORAL_TABLET | Freq: Three times a day (TID) | ORAL | Status: DC | PRN

## 2012-05-03 MED ORDER — OXYMORPHONE HCL ER 20 MG PO TB12: 20.0000 mg | ORAL_TABLET | Freq: Two times a day (BID) | ORAL | Status: DC

## 2012-05-03 NOTE — Progress Notes (Signed)
Subjective:    Patient ID: Kathy [name], female    DOB: May 27, 1966, 46 y.o.   MRN: [id number]  HPI The patient complains about chronic low back pain . The patient denies any radiation. The patient also complains about pain in her right foot, and in her right achilles tendon. She reports, that she has seen her GI doctor, who might consider another surgery. She will see her again on Nov. 18 th.  Otherwise the problem has been stable .   Pain Inventory Average Pain 8 Pain Right Now 10 My pain is sharp and aching  In the last 24 hours, has pain interfered with the following? General activity 10 Relation with others 10 Enjoyment of life 10 What TIME of day is your pain at its worst? morning Sleep (in general) Poor  Pain is worse with: walking, standing and some activites Pain improves with: rest, medication and TENS Relief from Meds: 4  Mobility walk without assistance ability to climb steps?  yes do you drive?  yes  Function employed # of hrs/week 18 disabled: date disabled  I need assistance with the following:  household duties  Neuro/Psych numbness tingling trouble walking dizziness depression anxiety  Prior Studies Any changes since last visit?  no  Physicians involved in your care Any changes since last visit?  no   Family History  Problem Relation Age of Onset  . Heart disease Mother   . Diabetes Mother   . Hypertension Mother   . Heart disease Brother   . Diabetes Brother   . Hypertension Brother   . Heart disease Brother   . Diabetes Brother   . Hypertension Brother    History   Social History  . Marital Status: Single    Spouse Name: N/A    Number of Children: N/A  . Years of Education: N/A   Social History Main Topics  . Smoking status: Never Smoker   . Smokeless tobacco: Never Used  . Alcohol Use: No  . Drug Use: No  . Sexually Active: None   Other Topics Concern  . None   Social History Narrative  . None   Past Surgical  History  Procedure Date  . Hernia repair   . Cholecystectomy   . Abdominal hysterectomy   . Foot surgery   . Carpal tunnel release    Past Medical History  Diagnosis Date  . Degeneration of lumbar or lumbosacral intervertebral disc   . Enthesopathy of hip region   . Unequal leg length (acquired)   . Disorders of sacrum   . Facet syndrome, lumbar   . Myalgia and myositis, unspecified    BP 152/80  Pulse 62  Resp 14  Ht 5' (1.524 m)  Wt 174 lb 3.2 oz (79.017 kg)  BMI 34.02 kg/m2  SpO2 96%    Review of Systems  Constitutional: Positive for appetite change.  Cardiovascular: Positive for leg swelling.  Gastrointestinal: Positive for nausea, vomiting, abdominal pain and constipation.  Musculoskeletal: Positive for back pain.       Foot pain  Psychiatric/Behavioral: Positive for dysphoric mood. The patient is nervous/anxious.   All other systems reviewed and are negative.       Objective:   Physical Exam Constitutional: She is oriented to person, place, and time. She appears well-developed and well-nourished.  HENT:  Head: Normocephalic.  Neck: Neck supple.  Musculoskeletal: She exhibits tenderness.  Neurological: She is alert and oriented to person, place, and time.  Skin: Skin is warm and  dry.  Psychiatric: She has a normal mood and affect.  Symmetric normal motor tone is noted throughout. Normal muscle bulk. Muscle testing reveals 5/5 muscle strength of the upper extremity, and 5/5 of the lower extremity. Full range of motion in upper and lower extremities. ROM of spine is mildly restricted.  DTR in the upper and lower extremity are present and symmetric 2+. No clonus is noted.  Patient arises from chair without difficulty. Narrow based gait with normal arm swing bilateral . Tandem walk is stable.  Tenderness on right paraspinal muscles in L-spine, at her right achilles tendon, but no swelling in this area, and her right foot        Assessment & Plan:  This is  46 year old female with  1. Fibromyalgia  2. Lumbar spondylolisthesis  3.Polyarthralgia  4. Anxiety and depression  5. right foot pain, and right achilles tendon pain  6. Multiple GI issues, seeing a specialist , Dr. Jacinto Reap , at [facility]. Will follow up on Nov. 18th for possible surgery. Plan :  Continue with your medication. Showed patient some exercises stretches and massage techniques for her right for her right achilles tendon, and right foot pain. Discuss with patient that she should do some core strengthening exercises, and that walking or exercises in the pool would be very beneficial for her.Advised patient to apply Voltaren gel on the inflamed Achilles tendon.

## 2012-05-03 NOTE — Patient Instructions (Signed)
Try to go back into aquatic exercising. Try to be as active as tolerated.

## 2012-05-04 ENCOUNTER — Encounter: Payer: Medicare Other | Admitting: Physical Medicine and Rehabilitation

## 2012-05-28 ENCOUNTER — Encounter: Payer: Self-pay | Admitting: Physical Medicine and Rehabilitation

## 2012-05-28 ENCOUNTER — Encounter
Payer: Medicare Other | Attending: Physical Medicine and Rehabilitation | Admitting: Physical Medicine and Rehabilitation

## 2012-05-28 VITALS — BP 180/93 | HR 97 | Resp 14 | Ht 60.0 in | Wt 175.0 lb

## 2012-05-28 DIAGNOSIS — M47816 Spondylosis without myelopathy or radiculopathy, lumbar region: Secondary | ICD-10-CM

## 2012-05-28 DIAGNOSIS — M2550 Pain in unspecified joint: Secondary | ICD-10-CM | POA: Insufficient documentation

## 2012-05-28 DIAGNOSIS — M545 Low back pain, unspecified: Secondary | ICD-10-CM | POA: Insufficient documentation

## 2012-05-28 DIAGNOSIS — F3289 Other specified depressive episodes: Secondary | ICD-10-CM | POA: Insufficient documentation

## 2012-05-28 DIAGNOSIS — IMO0001 Reserved for inherently not codable concepts without codable children: Secondary | ICD-10-CM | POA: Insufficient documentation

## 2012-05-28 DIAGNOSIS — F411 Generalized anxiety disorder: Secondary | ICD-10-CM | POA: Insufficient documentation

## 2012-05-28 DIAGNOSIS — M47817 Spondylosis without myelopathy or radiculopathy, lumbosacral region: Secondary | ICD-10-CM

## 2012-05-28 DIAGNOSIS — G5750 Tarsal tunnel syndrome, unspecified lower limb: Secondary | ICD-10-CM

## 2012-05-28 DIAGNOSIS — F329 Major depressive disorder, single episode, unspecified: Secondary | ICD-10-CM | POA: Insufficient documentation

## 2012-05-28 DIAGNOSIS — M79609 Pain in unspecified limb: Secondary | ICD-10-CM | POA: Insufficient documentation

## 2012-05-28 DIAGNOSIS — Q762 Congenital spondylolisthesis: Secondary | ICD-10-CM | POA: Insufficient documentation

## 2012-05-28 MED ORDER — ALPRAZOLAM 1 MG PO TABS: 1.0000 mg | ORAL_TABLET | Freq: Two times a day (BID) | ORAL | Status: DC

## 2012-05-28 MED ORDER — OXYCODONE HCL 15 MG PO TABS: 15.0000 mg | ORAL_TABLET | Freq: Three times a day (TID) | ORAL | Status: DC | PRN

## 2012-05-28 MED ORDER — METHOCARBAMOL 500 MG PO TABS: 500.0000 mg | ORAL_TABLET | Freq: Three times a day (TID) | ORAL | Status: DC

## 2012-05-28 MED ORDER — OXYMORPHONE HCL ER 20 MG PO TB12: 20.0000 mg | ORAL_TABLET | Freq: Two times a day (BID) | ORAL | Status: DC

## 2012-05-28 MED ORDER — CYANOCOBALAMIN 1000 MCG/ML IJ SOLN: 1000.0000 ug | INTRAMUSCULAR | Status: AC

## 2012-05-28 NOTE — Patient Instructions (Addendum)
Try to stay as active as tolerated. Try to restart aquatic exercising.

## 2012-05-28 NOTE — Progress Notes (Signed)
Subjective:    Patient ID: Kathy [name], female    DOB: 09-17-1965, 46 y.o.   MRN: [id number]  HPI The patient complains about chronic low back pain . The patient denies any radiation. The patient also complains about pain in her right foot, and in her right achilles tendon. She reports, that she has seen her GI doctor, who might consider another surgery.  Otherwise the problem has been stable .   Pain Inventory Average Pain 9 Pain Right Now 9 My pain is sharp, burning, stabbing, tingling and aching  In the last 24 hours, has pain interfered with the following? General activity 8 Relation with others 9 Enjoyment of life 9 What TIME of day is your pain at its worst? day and night Sleep (in general) Poor  Pain is worse with: walking, bending, standing and some activites Pain improves with: rest, medication and injections Relief from Meds: 8  Mobility walk without assistance how many minutes can you walk? 30 ability to climb steps?  yes do you drive?  yes transfers alone Do you have any goals in this area?  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  yes  Neuro/Psych bowel control problems weakness numbness tingling trouble walking spasms dizziness depression anxiety  Prior Studies Any changes since last visit?  yes  Physicians involved in your care Any changes since last visit?  no   Family History  Problem Relation Age of Onset  . Heart disease Mother   . Diabetes Mother   . Hypertension Mother   . Heart disease Brother   . Diabetes Brother   . Hypertension Brother   . Heart disease Brother   . Diabetes Brother   . Hypertension Brother    History   Social History  . Marital Status: Single    Spouse Name: N/A    Number of Children: N/A  . Years of Education: N/A   Social History Main Topics  . Smoking status: Never  Smoker   . Smokeless tobacco: Never Used  . Alcohol Use: No  . Drug Use: No  . Sexually Active: None   Other Topics Concern  . None   Social History Narrative  . None   Past Surgical History  Procedure Date  . Hernia repair   . Cholecystectomy   . Abdominal hysterectomy   . Foot surgery   . Carpal tunnel release    Past Medical History  Diagnosis Date  . Degeneration of lumbar or lumbosacral intervertebral disc   . Enthesopathy of hip region   . Unequal leg length (acquired)   . Disorders of sacrum   . Facet syndrome, lumbar   . Myalgia and myositis, unspecified    BP 180/93  Pulse 97  Resp 14  Ht 5' (1.524 m)  Wt 175 lb (79.379 kg)  BMI 34.18 kg/m2  SpO2 100%     Review of Systems  Constitutional: Positive for fever, chills and unexpected weight change.  Gastrointestinal: Positive for nausea and abdominal pain.  Musculoskeletal: Positive for myalgias, back pain, arthralgias and gait problem.  Neurological: Positive for dizziness, weakness and numbness.  Psychiatric/Behavioral: Positive for dysphoric mood. The patient is nervous/anxious.   All other systems reviewed and are negative.       Objective:   Physical Exam Constitutional: She is oriented to person, place, and time. She appears well-developed and  well-nourished.  HENT:  Head: Normocephalic.  Neck: Neck supple.  Musculoskeletal: She exhibits tenderness.  Neurological: She is alert and oriented to person, place, and time.  Skin: Skin is warm and dry.  Psychiatric: She has a normal mood and affect.  Symmetric normal motor tone is noted throughout. Normal muscle bulk. Muscle testing reveals 5/5 muscle strength of the upper extremity, and 5/5 of the lower extremity. Full range of motion in upper and lower extremities. ROM of spine is mildly restricted.  DTR in the upper and lower extremity are present and symmetric 2+. No clonus is noted.  Patient arises from chair without difficulty. Narrow based  gait with normal arm swing bilateral . Tandem walk is stable.  Tenderness on right paraspinal muscles in L-spine, at her right achilles tendon, but no swelling in this area, and her right foot        Assessment & Plan:  This is 46 year old female with  1. Fibromyalgia  2. Lumbar spondylolisthesis  3.Polyarthralgia  4. Anxiety and depression  5. right foot pain, and right achilles tendon pain  6. Multiple GI issues, saw a specialist , Dr. Maudry Diego , at [facility] on Nov. 18th for possible surgery.  Plan :  Continue with your medication. Showed patient some exercises stretches and massage techniques for her right for her right achilles tendon, and right foot pain. Discuss with patient that she should do some core strengthening exercises, and that walking or exercises in the pool would be very beneficial for her.Advised patient to apply Voltaren gel on the inflamed Achilles tendon. Patient states, that she spilled the rest of her Oxycodone down the sink, I told her that she can only refill her meds when she is due.Patient is also on opana, she still has those until she is due in 3-4 days, therefore she will not have severe withdrawal symptoms. Patient also complains about muscle cramping pain in her neck, prescribed Robaxin 500mg  tid. Follow up in one month.

## 2012-06-21 ENCOUNTER — Telehealth: Payer: Self-pay | Admitting: *Deleted

## 2012-06-21 DIAGNOSIS — F329 Major depressive disorder, single episode, unspecified: Secondary | ICD-10-CM

## 2012-06-21 DIAGNOSIS — IMO0001 Reserved for inherently not codable concepts without codable children: Secondary | ICD-10-CM

## 2012-06-21 DIAGNOSIS — G5750 Tarsal tunnel syndrome, unspecified lower limb: Secondary | ICD-10-CM

## 2012-06-21 DIAGNOSIS — M47816 Spondylosis without myelopathy or radiculopathy, lumbar region: Secondary | ICD-10-CM

## 2012-06-21 MED ORDER — OXYCODONE HCL 15 MG PO TABS: 15.0000 mg | ORAL_TABLET | Freq: Three times a day (TID) | ORAL | Status: DC | PRN

## 2012-06-21 MED ORDER — OXYMORPHONE HCL ER 20 MG PO TB12: 20.0000 mg | ORAL_TABLET | Freq: Two times a day (BID) | ORAL | Status: DC

## 2012-06-21 NOTE — Telephone Encounter (Signed)
Patient needs refills on Oxycodone, Opana, and Xanax

## 2012-06-21 NOTE — Telephone Encounter (Signed)
Prescriptions for Oxycodone and Opana were printed out for Dr. Riley Kill to sign. Patient should still have refills on Xanax

## 2012-06-22 NOTE — Telephone Encounter (Signed)
Patient aware script is ready for pick up 

## 2012-07-02 ENCOUNTER — Encounter
Payer: Medicare Other | Attending: Physical Medicine and Rehabilitation | Admitting: Physical Medicine and Rehabilitation

## 2012-07-02 ENCOUNTER — Encounter: Payer: Self-pay | Admitting: Physical Medicine and Rehabilitation

## 2012-07-02 VITALS — BP 137/84 | HR 65 | Resp 14 | Ht 60.0 in | Wt 178.3 lb

## 2012-07-02 DIAGNOSIS — F32A Depression, unspecified: Secondary | ICD-10-CM

## 2012-07-02 DIAGNOSIS — IMO0001 Reserved for inherently not codable concepts without codable children: Secondary | ICD-10-CM

## 2012-07-02 DIAGNOSIS — F329 Major depressive disorder, single episode, unspecified: Secondary | ICD-10-CM

## 2012-07-02 DIAGNOSIS — M47817 Spondylosis without myelopathy or radiculopathy, lumbosacral region: Secondary | ICD-10-CM | POA: Insufficient documentation

## 2012-07-02 DIAGNOSIS — M47816 Spondylosis without myelopathy or radiculopathy, lumbar region: Secondary | ICD-10-CM

## 2012-07-02 DIAGNOSIS — G5750 Tarsal tunnel syndrome, unspecified lower limb: Secondary | ICD-10-CM

## 2012-07-02 DIAGNOSIS — F411 Generalized anxiety disorder: Secondary | ICD-10-CM | POA: Insufficient documentation

## 2012-07-02 DIAGNOSIS — K660 Peritoneal adhesions (postprocedural) (postinfection): Secondary | ICD-10-CM | POA: Insufficient documentation

## 2012-07-02 DIAGNOSIS — F3289 Other specified depressive episodes: Secondary | ICD-10-CM

## 2012-07-02 DIAGNOSIS — M79609 Pain in unspecified limb: Secondary | ICD-10-CM | POA: Insufficient documentation

## 2012-07-02 DIAGNOSIS — M2550 Pain in unspecified joint: Secondary | ICD-10-CM | POA: Insufficient documentation

## 2012-07-02 MED ORDER — OXYMORPHONE HCL ER 20 MG PO TB12: 20.0000 mg | ORAL_TABLET | Freq: Two times a day (BID) | ORAL | Status: DC

## 2012-07-02 MED ORDER — OXYCODONE HCL 15 MG PO TABS: 15.0000 mg | ORAL_TABLET | Freq: Three times a day (TID) | ORAL | Status: DC | PRN

## 2012-07-02 NOTE — Progress Notes (Signed)
Subjective:    Patient ID: Kathy [name], female    DOB: Feb 10, 1966, 46 y.o.   MRN: [id number]  HPI The patient complains about chronic low back pain . The patient denies any radiation. The patient also complains about pain in her right foot, and in her right achilles tendon. She reports, that she has seen her GI doctor, who might consider another surgery.  Otherwise the problem has been stable .   Pain Inventory Average Pain 8 Pain Right Now 9 My pain is constant, sharp, burning and tingling  In the last 24 hours, has pain interfered with the following? General activity 9 Relation with others 9 Enjoyment of life 10 What TIME of day is your pain at its worst? morning and evenings Sleep (in general) Fair  Pain is worse with: walking, bending and some activites Pain improves with: rest and medication Relief from Meds: 8  Mobility walk without assistance how many minutes can you walk? 5 ability to climb steps?  yes do you drive?  yes Do you have any goals in this area?  yes  Function disabled: date disabled 2009 I need assistance with the following:  dressing, bathing and shopping  Neuro/Psych weakness numbness tingling trouble walking anxiety  Prior Studies Any changes since last visit?  no  Physicians involved in your care Any changes since last visit?  no   Family History  Problem Relation Age of Onset  . Heart disease Mother   . Diabetes Mother   . Hypertension Mother   . Heart disease Brother   . Diabetes Brother   . Hypertension Brother   . Heart disease Brother   . Diabetes Brother   . Hypertension Brother    History   Social History  . Marital Status: Single    Spouse Name: N/A    Number of Children: N/A  . Years of Education: N/A   Social History Main Topics  . Smoking status: Never Smoker   . Smokeless tobacco: Never Used  . Alcohol Use: No  . Drug Use: No  . Sexually Active: None   Other Topics Concern  . None   Social History  Narrative  . None   Past Surgical History  Procedure Date  . Hernia repair   . Cholecystectomy   . Abdominal hysterectomy   . Foot surgery   . Carpal tunnel release    Past Medical History  Diagnosis Date  . Degeneration of lumbar or lumbosacral intervertebral disc   . Enthesopathy of hip region   . Unequal leg length (acquired)   . Disorders of sacrum   . Facet syndrome, lumbar   . Myalgia and myositis, unspecified    BP 137/84  Pulse 65  Resp 14  Ht 5' (1.524 m)  Wt 178 lb 4.8 oz (80.876 kg)  BMI 34.82 kg/m2  SpO2 99%    Review of Systems  Constitutional: Positive for appetite change and unexpected weight change.  Gastrointestinal: Positive for diarrhea and constipation.  Musculoskeletal: Positive for myalgias, arthralgias and gait problem.  Neurological: Positive for weakness and numbness.       Tingling  All other systems reviewed and are negative.       Objective:   Physical Exam Constitutional: She is oriented to person, place, and time. She appears well-developed and well-nourished.  HENT:  Head: Normocephalic.  Neck: Neck supple.  Musculoskeletal: She exhibits tenderness.  Neurological: She is alert and oriented to person, place, and time.  Skin: Skin is warm and  dry.  Psychiatric: She has a normal mood and affect.  Symmetric normal motor tone is noted throughout. Normal muscle bulk. Muscle testing reveals 5/5 muscle strength of the upper extremity, and 5/5 of the lower extremity. Full range of motion in upper and lower extremities. ROM of spine is mildly restricted.  DTR in the upper and lower extremity are present and symmetric 2+. No clonus is noted.  Patient arises from chair without difficulty. Narrow based gait with normal arm swing bilateral . Tandem walk is stable.  Tenderness on right paraspinal muscles in L-spine, at her right achilles tendon, but no swelling in this area, and her right foot        Assessment & Plan:  This is 46 year old  female with  1. Fibromyalgia  2. Lumbar spondylolisthesis  3.Polyarthralgia  4. Anxiety and depression ,the patient states, that she will see a pain psychologist in [place] in January, which I think could be very beneficial for her. 5. right foot pain, and right achilles tendon pain  6. Multiple issues,adhesions in abdomen will see a specialist , Dr. Jacinto Reap and Dr. Maudry Diego , at [facility] in Jan. for considering possible surgery.  Plan :  Continue with your medication. Showed patient some exercises stretches and massage techniques for her right for her right achilles tendon, and right foot pain. Discuss with patient that she should do some core strengthening exercises, and that walking or exercises in the pool would be very beneficial for her.Advised patient to apply Voltaren gel on the inflamed Achilles tendon.  Patient also complained about muscle cramping pain in her neck, prescribed Robaxin 500mg  tid at her last visit, which has helped some.  Follow up in 6 weeks.

## 2012-07-02 NOTE — Patient Instructions (Signed)
Try to walk as pain permits.

## 2012-08-27 ENCOUNTER — Encounter: Payer: Self-pay | Admitting: Physical Medicine and Rehabilitation

## 2012-08-27 ENCOUNTER — Encounter
Payer: Medicare Other | Attending: Physical Medicine and Rehabilitation | Admitting: Physical Medicine and Rehabilitation

## 2012-08-27 VITALS — BP 158/88 | HR 71 | Resp 14 | Ht 60.0 in | Wt 168.0 lb

## 2012-08-27 DIAGNOSIS — K660 Peritoneal adhesions (postprocedural) (postinfection): Secondary | ICD-10-CM | POA: Insufficient documentation

## 2012-08-27 DIAGNOSIS — F329 Major depressive disorder, single episode, unspecified: Secondary | ICD-10-CM

## 2012-08-27 DIAGNOSIS — M47816 Spondylosis without myelopathy or radiculopathy, lumbar region: Secondary | ICD-10-CM

## 2012-08-27 DIAGNOSIS — F411 Generalized anxiety disorder: Secondary | ICD-10-CM | POA: Insufficient documentation

## 2012-08-27 DIAGNOSIS — M79609 Pain in unspecified limb: Secondary | ICD-10-CM | POA: Insufficient documentation

## 2012-08-27 DIAGNOSIS — Q762 Congenital spondylolisthesis: Secondary | ICD-10-CM | POA: Insufficient documentation

## 2012-08-27 DIAGNOSIS — F3289 Other specified depressive episodes: Secondary | ICD-10-CM | POA: Insufficient documentation

## 2012-08-27 DIAGNOSIS — IMO0001 Reserved for inherently not codable concepts without codable children: Secondary | ICD-10-CM | POA: Insufficient documentation

## 2012-08-27 DIAGNOSIS — M2550 Pain in unspecified joint: Secondary | ICD-10-CM | POA: Insufficient documentation

## 2012-08-27 MED ORDER — OXYCODONE HCL 15 MG PO TABS: 15.0000 mg | ORAL_TABLET | Freq: Three times a day (TID) | ORAL | Status: DC | PRN

## 2012-08-27 MED ORDER — OXYMORPHONE HCL ER 20 MG PO TB12: 20.0000 mg | ORAL_TABLET | Freq: Two times a day (BID) | ORAL | Status: DC

## 2012-08-27 NOTE — Progress Notes (Signed)
Subjective:    Patient ID: Kathy [name], female    DOB: September 03, 1965, 47 y.o.   MRN: [id number]  HPI The patient complains about chronic low back pain . The patient denies any radiation. The patient also complains about pain in her right foot, and in her right achilles tendon. She reports, that she has kidney stones, and might have them removed end of march.  Otherwise the problem has been stable .   Pain Inventory Average Pain 9 Pain Right Now 9 My pain is constant, burning, stabbing, tingling and aching  In the last 24 hours, has pain interfered with the following? General activity 9 Relation with others 7 Enjoyment of life 9 What TIME of day is your pain at its worst? day, evening and night Sleep (in general) Poor  Pain is worse with: walking, bending, sitting, inactivity and some activites Pain improves with: rest, medication and injections Relief from Meds: 7  Mobility walk without assistance ability to climb steps?  yes do you drive?  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  no   Family History  Problem Relation Age of Onset  . Heart disease Mother   . Diabetes Mother   . Hypertension Mother   . Heart disease Brother   . Diabetes Brother   . Hypertension Brother   . Heart disease Brother   . Diabetes Brother   . Hypertension Brother    History   Social History  . Marital Status: Single    Spouse Name: N/A    Number of Children: N/A  . Years of Education: N/A   Social History Main Topics  . Smoking status: Never Smoker   . Smokeless tobacco: Never Used  . Alcohol Use: No  . Drug Use: No  . Sexually Active: None   Other Topics Concern  . None   Social History Narrative  . None   Past Surgical History  Procedure Laterality Date  . Hernia  repair    . Cholecystectomy    . Abdominal hysterectomy    . Foot surgery    . Carpal tunnel release     Past Medical History  Diagnosis Date  . Degeneration of lumbar or lumbosacral intervertebral disc   . Enthesopathy of hip region   . Unequal leg length (acquired)   . Disorders of sacrum   . Facet syndrome, lumbar   . Myalgia and myositis, unspecified    BP 158/88  Pulse 71  Resp 14  Ht 5' (1.524 m)  Wt 168 lb (76.204 kg)  BMI 32.81 kg/m2  SpO2 100%     Review of Systems  Constitutional: Positive for diaphoresis and unexpected weight change.  Gastrointestinal: Positive for nausea, vomiting and abdominal pain.  Musculoskeletal: Positive for myalgias and arthralgias.  Neurological: Positive for dizziness, weakness and numbness.  Psychiatric/Behavioral: The patient is nervous/anxious.   All other systems reviewed and are negative.       Objective:   Physical Exam Constitutional: She is oriented to person, place, and time. She appears well-developed and well-nourished.  HENT:  Head: Normocephalic.  Neck: Neck supple.  Musculoskeletal: She exhibits tenderness.  Neurological: She is alert and oriented to person, place, and time.  Skin: Skin is warm and dry.  Psychiatric: She has a normal mood and affect.  Symmetric normal motor tone  is noted throughout. Normal muscle bulk. Muscle testing reveals 5/5 muscle strength of the upper extremity, and 5/5 of the lower extremity. Full range of motion in upper and lower extremities. ROM of spine is mildly restricted.  DTR in the upper and lower extremity are present and symmetric 2+. No clonus is noted.  Patient arises from chair without difficulty. Narrow based gait with normal arm swing bilateral . Tandem walk is stable.  Tenderness on right paraspinal muscles in L-spine, at her right achilles tendon, but no swelling in this area, and her right foot        Assessment & Plan:  This is 47 year old female with  1.  Fibromyalgia  2. Lumbar spondylolisthesis  3.Polyarthralgia  4. Anxiety and depression ,the patient states, that she will see a pain psychologist in [place] in January, which I think could be very beneficial for her.  5. right foot pain, and right achilles tendon pain  6. Multiple issues,adhesions in abdomen will see a specialist , Dr. Jacinto Reap and Dr. Maudry Diego , at [facility] in Jan. for considering possible surgery.  Plan :  Continue with your medication.  Discuss with patient that she should do some core strengthening exercises, and that walking or exercises in the pool would be very beneficial for her, when she can tolerate it. Patient also complained about muscle cramping pain in her neck, prescribed Robaxin 500mg  tid at her last visit, which has helped some.  Patient did not bring her medication bottles, this has happened at least at 3 other occations, she also had one visit, where she was short, and she and her mother stated that she has dripped some tablets into the sink. I educated her about her contract, which she signed, that she has to bring her medication to every visit. I also told her if there are inconsistencies again, or she does not bring her bottles to the visit, we will not prescribe narcotics anymore. UDS at next visit ! Follow up in 1 month.Marland Kitchen

## 2012-08-27 NOTE — Patient Instructions (Signed)
Bring your med bottles to every visit.

## 2012-09-21 ENCOUNTER — Encounter
Payer: Medicare Other | Attending: Physical Medicine and Rehabilitation | Admitting: Physical Medicine and Rehabilitation

## 2012-09-21 ENCOUNTER — Encounter: Payer: Self-pay | Admitting: Physical Medicine and Rehabilitation

## 2012-09-21 VITALS — BP 147/82 | HR 85 | Resp 14 | Ht 60.0 in | Wt 168.0 lb

## 2012-09-21 DIAGNOSIS — M545 Low back pain, unspecified: Secondary | ICD-10-CM | POA: Insufficient documentation

## 2012-09-21 DIAGNOSIS — M47817 Spondylosis without myelopathy or radiculopathy, lumbosacral region: Secondary | ICD-10-CM

## 2012-09-21 DIAGNOSIS — M2550 Pain in unspecified joint: Secondary | ICD-10-CM | POA: Insufficient documentation

## 2012-09-21 DIAGNOSIS — Z5181 Encounter for therapeutic drug level monitoring: Secondary | ICD-10-CM

## 2012-09-21 DIAGNOSIS — Q762 Congenital spondylolisthesis: Secondary | ICD-10-CM | POA: Insufficient documentation

## 2012-09-21 DIAGNOSIS — F329 Major depressive disorder, single episode, unspecified: Secondary | ICD-10-CM

## 2012-09-21 DIAGNOSIS — K660 Peritoneal adhesions (postprocedural) (postinfection): Secondary | ICD-10-CM | POA: Insufficient documentation

## 2012-09-21 DIAGNOSIS — M47816 Spondylosis without myelopathy or radiculopathy, lumbar region: Secondary | ICD-10-CM

## 2012-09-21 DIAGNOSIS — IMO0001 Reserved for inherently not codable concepts without codable children: Secondary | ICD-10-CM | POA: Insufficient documentation

## 2012-09-21 DIAGNOSIS — F3289 Other specified depressive episodes: Secondary | ICD-10-CM | POA: Insufficient documentation

## 2012-09-21 DIAGNOSIS — M79609 Pain in unspecified limb: Secondary | ICD-10-CM | POA: Insufficient documentation

## 2012-09-21 DIAGNOSIS — F411 Generalized anxiety disorder: Secondary | ICD-10-CM | POA: Insufficient documentation

## 2012-09-21 DIAGNOSIS — Z79899 Other long term (current) drug therapy: Secondary | ICD-10-CM

## 2012-09-21 MED ORDER — ALPRAZOLAM 1 MG PO TABS: 1.0000 mg | ORAL_TABLET | Freq: Two times a day (BID) | ORAL | Status: DC

## 2012-09-21 MED ORDER — OXYMORPHONE HCL ER 20 MG PO TB12: 20.0000 mg | ORAL_TABLET | Freq: Two times a day (BID) | ORAL | Status: DC

## 2012-09-21 MED ORDER — OXYCODONE HCL 15 MG PO TABS: 15.0000 mg | ORAL_TABLET | Freq: Three times a day (TID) | ORAL | Status: DC | PRN

## 2012-09-21 NOTE — Patient Instructions (Signed)
Stay as active as tolerated. 

## 2012-09-21 NOTE — Progress Notes (Signed)
Subjective:    Patient ID: Kathy [name], female    DOB: 1966-05-04, 47 y.o.   MRN: [id number]  HPI The patient complains about chronic low back pain . The patient denies any radiation. The patient also complains about pain in her right foot, and in her right achilles tendon. She reports, that she has kidney stones, and will have them removed 25th, of march at [place].  Otherwise the problem has been stable .   Pain Inventory Average Pain 9 Pain Right Now 9 My pain is sharp, burning, dull, stabbing, tingling and aching  In the last 24 hours, has pain interfered with the following? General activity 8 Relation with others 9 Enjoyment of life 9 What TIME of day is your pain at its worst? all the time Sleep (in general) Poor  Pain is worse with: bending, sitting and inactivity Pain improves with: rest, medication and injections Relief from Meds: 7  Mobility walk without assistance  Function disabled: date disabled unknown I need assistance with the following:  bathing, meal prep, household duties and shopping  Neuro/Psych bladder control problems bowel control problems weakness numbness tingling dizziness anxiety loss of taste or smell  Prior Studies Any changes since last visit?  no  Physicians involved in your care Any changes since last visit?  no   Family History  Problem Relation Age of Onset  . Heart disease Mother   . Diabetes Mother   . Hypertension Mother   . Heart disease Brother   . Diabetes Brother   . Hypertension Brother   . Heart disease Brother   . Diabetes Brother   . Hypertension Brother    History   Social History  . Marital Status: Single    Spouse Name: N/A    Number of Children: N/A  . Years of Education: N/A   Social History Main Topics  . Smoking status: Never Smoker   . Smokeless tobacco: Never Used  . Alcohol Use: No  . Drug Use: No  . Sexually Active: None   Other Topics Concern  . None   Social History Narrative  .  None   Past Surgical History  Procedure Laterality Date  . Hernia repair    . Cholecystectomy    . Abdominal hysterectomy    . Foot surgery    . Carpal tunnel release     Past Medical History  Diagnosis Date  . Degeneration of lumbar or lumbosacral intervertebral disc   . Enthesopathy of hip region   . Unequal leg length (acquired)   . Disorders of sacrum   . Facet syndrome, lumbar   . Myalgia and myositis, unspecified    BP 147/82  Pulse 85  Resp 14  Ht 5' (1.524 m)  Wt 168 lb (76.204 kg)  BMI 32.81 kg/m2  SpO2 99%     Review of Systems  Constitutional: Positive for chills, diaphoresis, fatigue and unexpected weight change.  HENT: Positive for neck pain.   Cardiovascular: Positive for leg swelling.  Gastrointestinal: Positive for nausea, vomiting, abdominal pain, diarrhea and constipation.  Genitourinary: Positive for difficulty urinating.  Musculoskeletal: Positive for back pain.  Neurological: Positive for dizziness, weakness and numbness.  Psychiatric/Behavioral: The patient is nervous/anxious.   All other systems reviewed and are negative.       Objective:   Physical Exam Constitutional: She is oriented to person, place, and time. She appears well-developed and well-nourished.  HENT:  Head: Normocephalic.  Neck: Neck supple.  Musculoskeletal: She exhibits tenderness.  Neurological: She is alert and oriented to person, place, and time.  Skin: Skin is warm and dry.  Psychiatric: She has a normal mood and affect.  Symmetric normal motor tone is noted throughout. Normal muscle bulk. Muscle testing reveals 5/5 muscle strength of the upper extremity, and 5/5 of the lower extremity. Full range of motion in upper and lower extremities. ROM of spine is mildly restricted.  DTR in the upper and lower extremity are present and symmetric 2+. No clonus is noted.  Patient arises from chair without difficulty. Narrow based gait with normal arm swing bilateral . Tandem  walk is stable.  Tenderness on right paraspinal muscles in L-spine, at her right achilles tendon, but no swelling in this area, and her right foot        Assessment & Plan:  This is 47 year old female with  1. Fibromyalgia  2. Lumbar spondylolisthesis  3.Polyarthralgia  4. Anxiety and depression ,the patient states, that she will see a pain psychologist in [place] in January, which I think could be very beneficial for her.  5. right foot pain, and right achilles tendon pain  6. Multiple issues,adhesions in abdomen will see a specialist , Dr. Jacinto Reap and Dr. Maudry Diego , at [facility] in Jan. for considering possible surgery.  Plan :  Continue with your medication. Discuss with patient that she should do some core strengthening exercises, and that walking or exercises in the pool would be very beneficial for her, when she can tolerate it. Patient also complained about muscle cramping pain in her neck, prescribed Robaxin 500mg  tid at her last visit, which has helped some.  Patient did not bring her medication bottles, this has happened at least at 3 other occations, she also had one visit, where she was short, and she and her mother stated that she has dripped some tablets into the sink. I educated her about her contract, which she signed, that she has to bring her medication to every visit. I also told her if there are inconsistencies again, or she does not bring her bottles to the visit, we will not prescribe narcotics anymore. UDS at this visit !   She reports, that she has kidney stones, and will have them removed 25th, of march at [place].  I told her if the pain from the surgery is severe she can take one extra tablet of her oxycodone per day , if needed, for  5 days post op. Follow up in 1 month.Marland Kitchen

## 2012-09-26 HISTORY — PX: KIDNEY STONE SURGERY: SHX686

## 2012-10-02 ENCOUNTER — Telehealth: Payer: Self-pay

## 2012-10-02 NOTE — Telephone Encounter (Signed)
Message copied by Judd Gaudier on Tue Oct 02, 2012  3:39 PM ------      Message from: Su Monks      Created: Thu Sep 27, 2012  8:43 AM       Please ask patient whether she takes her Xanax regularly bid, or prn ------

## 2012-10-02 NOTE — Telephone Encounter (Signed)
Patient says she does not take xanax when she knows she is going to be driving long distance.  Other than that she says she does take them regularly.  She did says she took one about 4 am that day.

## 2012-10-03 NOTE — Telephone Encounter (Signed)
Ok, I will talk to her at her next visit, and we will keep an eye on her UDSs

## 2012-10-17 ENCOUNTER — Encounter: Payer: Self-pay | Admitting: Physical Medicine and Rehabilitation

## 2012-10-17 ENCOUNTER — Encounter
Payer: Medicare Other | Attending: Physical Medicine and Rehabilitation | Admitting: Physical Medicine and Rehabilitation

## 2012-10-17 ENCOUNTER — Ambulatory Visit: Payer: Medicare Other | Admitting: Physical Medicine and Rehabilitation

## 2012-10-17 VITALS — BP 152/87 | HR 59 | Resp 16 | Ht 60.0 in | Wt 172.0 lb

## 2012-10-17 DIAGNOSIS — IMO0001 Reserved for inherently not codable concepts without codable children: Secondary | ICD-10-CM

## 2012-10-17 DIAGNOSIS — M47816 Spondylosis without myelopathy or radiculopathy, lumbar region: Secondary | ICD-10-CM

## 2012-10-17 DIAGNOSIS — M47817 Spondylosis without myelopathy or radiculopathy, lumbosacral region: Secondary | ICD-10-CM | POA: Insufficient documentation

## 2012-10-17 DIAGNOSIS — F329 Major depressive disorder, single episode, unspecified: Secondary | ICD-10-CM

## 2012-10-17 DIAGNOSIS — F3289 Other specified depressive episodes: Secondary | ICD-10-CM | POA: Insufficient documentation

## 2012-10-17 MED ORDER — OXYMORPHONE HCL ER 20 MG PO TB12: 20.0000 mg | ORAL_TABLET | Freq: Two times a day (BID) | ORAL | Status: DC

## 2012-10-17 MED ORDER — OXYCODONE HCL 15 MG PO TABS: 15.0000 mg | ORAL_TABLET | Freq: Three times a day (TID) | ORAL | Status: DC | PRN

## 2012-10-17 NOTE — Progress Notes (Addendum)
Subjective:    Patient ID: Kathy [name], female    DOB: 12/18/1965, 47 y.o.   MRN: [id number]  HPI The patient complains about chronic low back pain . The patient denies any radiation. The patient also complains about pain in her right foot, and in her right achilles tendon. She reports, that she has kidney stones, and had them removed 25th, of march at [place]. She will have scar tissue removed in her abdomen on May 6th at [facility].  Otherwise the problem has been stable .   Pain Inventory Average Pain 9 Pain Right Now 10 My pain is intermittent, sharp, burning, dull, stabbing, tingling and aching  In the last 24 hours, has pain interfered with the following? General activity 8 Relation with others 8 Enjoyment of life 8 What TIME of day is your pain at its worst? evening,night time Sleep (in general) Fair  Pain is worse with: walking, bending, inactivity and some activites Pain improves with: rest, medication and injections Relief from Meds: 8  Mobility walk without assistance how many minutes can you walk? 3-4 or less ability to climb steps?  yes do you drive?  yes transfers alone Do you have any goals in this area?  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  no  Physicians involved in your care Any changes since last visit?  no   Family History  Problem Relation Age of Onset  . Heart disease Mother   . Diabetes Mother   . Hypertension Mother   . Heart disease Brother   . Diabetes Brother   . Hypertension Brother   . Heart disease Brother   . Diabetes Brother   . Hypertension Brother    History   Social History  . Marital Status: Single    Spouse Name: N/A    Number of Children: N/A  . Years of Education:  N/A   Social History Main Topics  . Smoking status: Never Smoker   . Smokeless tobacco: Never Used  . Alcohol Use: No  . Drug Use: No  . Sexually Active: None   Other Topics Concern  . None   Social History Narrative  . None   Past Surgical History  Procedure Laterality Date  . Hernia repair    . Cholecystectomy    . Abdominal hysterectomy    . Foot surgery    . Carpal tunnel release    . Kidney stone surgery Left 09-26-12   Past Medical History  Diagnosis Date  . Degeneration of lumbar or lumbosacral intervertebral disc   . Enthesopathy of hip region   . Unequal leg length (acquired)   . Disorders of sacrum   . Facet syndrome, lumbar   . Myalgia and myositis, unspecified    BP 152/87  Pulse 59  Resp 16  Ht 5' (1.524 m)  Wt 172 lb (78.019 kg)  BMI 33.59 kg/m2  SpO2 98%      Review of Systems  Constitutional: Positive for unexpected weight change.  Respiratory: Positive for shortness of breath.   Cardiovascular: Positive for leg swelling.  Gastrointestinal: Positive for vomiting, abdominal pain and constipation.  Musculoskeletal: Positive for gait problem.  Skin: Positive for rash.  Neurological: Positive for dizziness.  Psychiatric/Behavioral: Positive for dysphoric mood and agitation.  All other  systems reviewed and are negative.       Objective:   Physical Exam Constitutional: She is oriented to person, place, and time. She appears well-developed and well-nourished.  HENT:  Head: Normocephalic.  Neck: Neck supple.  Musculoskeletal: She exhibits tenderness.  Neurological: She is alert and oriented to person, place, and time.  Skin: Skin is warm and dry.  Psychiatric: She has a normal mood and affect.  Symmetric normal motor tone is noted throughout. Normal muscle bulk. Muscle testing reveals 5/5 muscle strength of the upper extremity, and 5/5 of the lower extremity. Full range of motion in upper and lower extremities. ROM of spine is mildly  restricted.  DTR in the upper and lower extremity are present and symmetric 2+. No clonus is noted.  Patient arises from chair without difficulty. Narrow based gait with normal arm swing bilateral . Tandem walk is stable.  Tenderness on right paraspinal muscles in L-spine, at her right achilles tendon, but no swelling in this area, and her right foot        Assessment & Plan:  This is 47 year old female with  1. Fibromyalgia  2. Lumbar spondylolisthesis  3.Polyarthralgia  4. Anxiety and depression ,the patient states, that she will see a pain psychologist in [place] in January, which I think could be very beneficial for her.  5. right foot pain, and right achilles tendon pain  6. Multiple issues,adhesions in abdomen will see a specialist , Dr. Jacinto Reap and Dr. Maudry Diego , at [facility] in Jan. for considering possible surgery.  Plan :  Continue with your medication. Discuss with patient that she should do some core strengthening exercises, and that walking or exercises in the pool would be very beneficial for her, when she can tolerate it. Patient also complained about muscle cramping pain in her neck, prescribed Robaxin 500mg  tid at her last visit, which has helped some.  Patient did not bring her medication bottles at the last visit, this has happened at least at 3 other occations, she also had one visit, where she was short, and she and her mother stated that she has dripped some tablets into the sink. I educated her about her contract, which she signed, that she has to bring her medication to every visit. I also told her if there are inconsistencies again, or she does not bring her bottles to the visit, we will not prescribe narcotics anymore. UDS at the last visit was inconsistent, it did not show Xanax, patient stated she took the Xanax at 4 am that day.   She reports, that she had kidney stones, removed 25th, of march at [place]. I told her if the pain from the surgery is severe she can take one extra tablet of  her oxycodone per day , if needed, for 5 days post op. The patient took extra tablets of her Opana, she took 9 extra tablets ! She states that she will see Dr. Riley Kill next time, I will talk to Dr. Riley Kill about her being inconsistent and violating her contract . Follow up in 1 month..   I talked to Dr. Riley Kill about the multiple inconsistencies, and contract violations, the patient had in the recent past. Dr. Riley Kill also has given the patient several second chances, I also warned the patient more than once that if there are any inconsistencies with her narcotics we will d/c her, because we do not feel it is safe for the patient to prescribe narcotics if she does not take them as  prescribed. Dr, Riley Kill stated that we should d/c the patient.

## 2012-10-17 NOTE — Patient Instructions (Signed)
Try to stay as active as tolerated, look into aquatic exercises.

## 2012-10-31 ENCOUNTER — Telehealth: Payer: Self-pay

## 2012-10-31 NOTE — Telephone Encounter (Signed)
Per Clydie Braun and Dr Riley Kill patient is to be discharged due to multiple inconsistency (pill counts, csa violations).  Patient was verbally informed and a letter with a list of other clinics were sent to her.

## 2012-11-13 ENCOUNTER — Ambulatory Visit: Payer: Medicare Other | Admitting: Physical Medicine & Rehabilitation

## 2012-11-19 ENCOUNTER — Telehealth: Payer: Self-pay

## 2012-11-19 DIAGNOSIS — M47816 Spondylosis without myelopathy or radiculopathy, lumbar region: Secondary | ICD-10-CM

## 2012-11-19 DIAGNOSIS — IMO0001 Reserved for inherently not codable concepts without codable children: Secondary | ICD-10-CM

## 2012-11-19 DIAGNOSIS — F329 Major depressive disorder, single episode, unspecified: Secondary | ICD-10-CM

## 2012-11-19 NOTE — Telephone Encounter (Signed)
Patient called requesting medication refill.  She has been discharged from the practice and says we have to give her at least one refill.  Please advise.

## 2012-11-20 MED ORDER — OXYMORPHONE HCL ER 20 MG PO TB12: 20.0000 mg | ORAL_TABLET | Freq: Every day | ORAL | Status: DC

## 2012-11-20 MED ORDER — OXYCODONE HCL 15 MG PO TABS: 15.0000 mg | ORAL_TABLET | ORAL | Status: DC

## 2012-11-20 NOTE — Telephone Encounter (Signed)
Wean down schedule : 1st week Opana ER 20mg  , one tablet a day #7 1st week Oxycodone 15mg  tid , #21  2nd week d/c Opana 2nd week Oxy tid # 21  3rd week Oxy 15mg  bid, #14  4th week Oxy one tablet a day # 7  She can get one prescription for Opana ER 20 mg, one tablet a day, # 7 And a prescription For Oxycodone 15mg  , sig see above, # 63

## 2012-11-20 NOTE — Telephone Encounter (Signed)
She had enough time to find some other provider, but we can give her a wean down amount, I will look exactly what we will prescribe and let you know.

## 2012-11-20 NOTE — Telephone Encounter (Signed)
Scripts printed.  Left message making patient aware.

## 2013-01-15 ENCOUNTER — Other Ambulatory Visit: Payer: Self-pay | Admitting: Physical Medicine and Rehabilitation

## 2013-04-05 ENCOUNTER — Other Ambulatory Visit: Payer: Self-pay | Admitting: Physical Medicine and Rehabilitation

## 2013-10-11 ENCOUNTER — Telehealth: Payer: Self-pay

## 2013-10-11 NOTE — Telephone Encounter (Signed)
I do not reinstate patient's had been previously dismissed

## 2013-10-11 NOTE — Telephone Encounter (Signed)
Patient called requesting be brought back as a patient.  Patient was discharged due to multiple providers.  She says she had surgery and was given the medication for her recovery.  Please advise.

## 2015-12-07 ENCOUNTER — Encounter: Payer: Self-pay | Admitting: Gastroenterology

## 2018-01-30 ENCOUNTER — Other Ambulatory Visit: Payer: Self-pay | Admitting: Physician Assistant

## 2018-01-30 DIAGNOSIS — M549 Dorsalgia, unspecified: Secondary | ICD-10-CM

## 2018-02-02 ENCOUNTER — Inpatient Hospital Stay
Admission: RE | Admit: 2018-02-02 | Discharge: 2018-02-02 | Disposition: A | Discharge status: Home / Self Care | Payer: Self-pay | Source: Ambulatory Visit | Attending: Physician Assistant | Admitting: Physician Assistant

## 2018-02-08 ENCOUNTER — Ambulatory Visit
Admission: RE | Admit: 2018-02-08 | Discharge: 2018-02-08 | Disposition: A | Discharge status: Home / Self Care | Payer: Medicare Other | Source: Ambulatory Visit | Attending: Physician Assistant | Admitting: Physician Assistant

## 2018-02-08 DIAGNOSIS — M549 Dorsalgia, unspecified: Secondary | ICD-10-CM

## 2019-01-16 ENCOUNTER — Other Ambulatory Visit: Payer: Self-pay | Admitting: Physician Assistant

## 2019-01-16 DIAGNOSIS — M5410 Radiculopathy, site unspecified: Secondary | ICD-10-CM

## 2019-02-10 ENCOUNTER — Ambulatory Visit
Admission: RE | Admit: 2019-02-10 | Discharge: 2019-02-10 | Disposition: A | Discharge status: Home / Self Care | Payer: 59 | Source: Ambulatory Visit | Attending: Physician Assistant | Admitting: Physician Assistant

## 2019-02-10 ENCOUNTER — Other Ambulatory Visit: Payer: Self-pay

## 2019-02-10 DIAGNOSIS — M5410 Radiculopathy, site unspecified: Secondary | ICD-10-CM

## 2019-06-24 ENCOUNTER — Other Ambulatory Visit: Payer: Self-pay

## 2019-06-24 ENCOUNTER — Other Ambulatory Visit: Payer: Self-pay | Admitting: Pain Medicine

## 2019-06-24 ENCOUNTER — Ambulatory Visit
Admission: RE | Admit: 2019-06-24 | Discharge: 2019-06-24 | Disposition: A | Discharge status: Home / Self Care | Payer: 59 | Source: Ambulatory Visit | Attending: Pain Medicine | Admitting: Pain Medicine

## 2019-06-24 DIAGNOSIS — R609 Edema, unspecified: Secondary | ICD-10-CM

## 2019-06-24 DIAGNOSIS — M25562 Pain in left knee: Secondary | ICD-10-CM

## 2019-07-23 ENCOUNTER — Other Ambulatory Visit: Payer: Self-pay | Admitting: Internal Medicine

## 2019-07-23 DIAGNOSIS — R1310 Dysphagia, unspecified: Secondary | ICD-10-CM

## 2019-07-29 ENCOUNTER — Other Ambulatory Visit: Payer: Self-pay | Admitting: Pain Medicine

## 2019-07-29 DIAGNOSIS — G8929 Other chronic pain: Secondary | ICD-10-CM

## 2019-08-01 ENCOUNTER — Ambulatory Visit
Admission: RE | Admit: 2019-08-01 | Discharge: 2019-08-01 | Disposition: A | Discharge status: Home / Self Care | Payer: 59 | Source: Ambulatory Visit | Attending: Internal Medicine | Admitting: Internal Medicine

## 2019-08-01 ENCOUNTER — Other Ambulatory Visit: Payer: Self-pay

## 2019-08-01 DIAGNOSIS — R1310 Dysphagia, unspecified: Secondary | ICD-10-CM

## 2019-08-01 MED ORDER — IOPAMIDOL (ISOVUE-300) INJECTION 61%
75.0000 mL | Freq: Once | INTRAVENOUS | Status: AC | PRN
  Administered 2019-08-01: 75 mL via INTRAVENOUS

## 2019-08-20 ENCOUNTER — Other Ambulatory Visit: Payer: Self-pay

## 2019-08-20 ENCOUNTER — Ambulatory Visit
Admission: RE | Admit: 2019-08-20 | Discharge: 2019-08-20 | Disposition: A | Discharge status: Home / Self Care | Payer: Medicare Other | Source: Ambulatory Visit | Attending: Pain Medicine | Admitting: Pain Medicine

## 2019-08-20 DIAGNOSIS — M25562 Pain in left knee: Secondary | ICD-10-CM

## 2019-08-20 DIAGNOSIS — G8929 Other chronic pain: Secondary | ICD-10-CM

## 2020-05-06 ENCOUNTER — Other Ambulatory Visit: Payer: Self-pay | Admitting: Physician Assistant

## 2020-05-11 ENCOUNTER — Other Ambulatory Visit: Payer: Self-pay | Admitting: Physician Assistant

## 2020-05-11 DIAGNOSIS — M47817 Spondylosis without myelopathy or radiculopathy, lumbosacral region: Secondary | ICD-10-CM

## 2020-05-19 ENCOUNTER — Other Ambulatory Visit: Payer: Self-pay

## 2020-05-19 ENCOUNTER — Ambulatory Visit
Admission: RE | Admit: 2020-05-19 | Discharge: 2020-05-19 | Disposition: A | Discharge status: Home / Self Care | Payer: Medicare Other | Source: Ambulatory Visit | Attending: Physician Assistant | Admitting: Physician Assistant

## 2020-05-19 DIAGNOSIS — M47817 Spondylosis without myelopathy or radiculopathy, lumbosacral region: Secondary | ICD-10-CM

## 2020-05-19 MED ORDER — GADOBENATE DIMEGLUMINE 529 MG/ML IV SOLN
15.0000 mL | Freq: Once | INTRAVENOUS | Status: AC | PRN
  Administered 2020-05-19: 15 mL via INTRAVENOUS

## 2020-05-23 ENCOUNTER — Other Ambulatory Visit: Payer: Medicare Other

## 2020-06-07 ENCOUNTER — Inpatient Hospital Stay (HOSPITAL_COMMUNITY)
Admission: EM | Admit: 2020-06-07 | Discharge: 2020-06-09 | DRG: 100 | Disposition: A | Discharge status: Home / Self Care | Payer: Medicare Other | Attending: Internal Medicine | Admitting: Internal Medicine

## 2020-06-07 ENCOUNTER — Emergency Department (HOSPITAL_COMMUNITY): Payer: Medicare Other

## 2020-06-07 ENCOUNTER — Encounter (HOSPITAL_COMMUNITY): Payer: Self-pay | Admitting: *Deleted

## 2020-06-07 ENCOUNTER — Other Ambulatory Visit: Payer: Self-pay

## 2020-06-07 DIAGNOSIS — E039 Hypothyroidism, unspecified: Secondary | ICD-10-CM | POA: Diagnosis present

## 2020-06-07 DIAGNOSIS — E876 Hypokalemia: Secondary | ICD-10-CM | POA: Diagnosis present

## 2020-06-07 DIAGNOSIS — G43909 Migraine, unspecified, not intractable, without status migrainosus: Secondary | ICD-10-CM | POA: Diagnosis present

## 2020-06-07 DIAGNOSIS — M47816 Spondylosis without myelopathy or radiculopathy, lumbar region: Secondary | ICD-10-CM | POA: Diagnosis present

## 2020-06-07 DIAGNOSIS — F32A Depression, unspecified: Secondary | ICD-10-CM | POA: Diagnosis present

## 2020-06-07 DIAGNOSIS — Z8249 Family history of ischemic heart disease and other diseases of the circulatory system: Secondary | ICD-10-CM

## 2020-06-07 DIAGNOSIS — Z9049 Acquired absence of other specified parts of digestive tract: Secondary | ICD-10-CM

## 2020-06-07 DIAGNOSIS — Z20822 Contact with and (suspected) exposure to covid-19: Secondary | ICD-10-CM | POA: Diagnosis present

## 2020-06-07 DIAGNOSIS — I10 Essential (primary) hypertension: Secondary | ICD-10-CM | POA: Diagnosis present

## 2020-06-07 DIAGNOSIS — Z87442 Personal history of urinary calculi: Secondary | ICD-10-CM

## 2020-06-07 DIAGNOSIS — Z91040 Latex allergy status: Secondary | ICD-10-CM

## 2020-06-07 DIAGNOSIS — G9340 Encephalopathy, unspecified: Secondary | ICD-10-CM | POA: Diagnosis present

## 2020-06-07 DIAGNOSIS — R569 Unspecified convulsions: Principal | ICD-10-CM

## 2020-06-07 DIAGNOSIS — G9341 Metabolic encephalopathy: Secondary | ICD-10-CM | POA: Diagnosis present

## 2020-06-07 DIAGNOSIS — Z885 Allergy status to narcotic agent status: Secondary | ICD-10-CM

## 2020-06-07 DIAGNOSIS — Z79899 Other long term (current) drug therapy: Secondary | ICD-10-CM

## 2020-06-07 DIAGNOSIS — Z881 Allergy status to other antibiotic agents status: Secondary | ICD-10-CM

## 2020-06-07 DIAGNOSIS — M5136 Other intervertebral disc degeneration, lumbar region: Secondary | ICD-10-CM | POA: Diagnosis present

## 2020-06-07 DIAGNOSIS — F411 Generalized anxiety disorder: Secondary | ICD-10-CM | POA: Diagnosis present

## 2020-06-07 LAB — CBC WITH DIFFERENTIAL/PLATELET
Abs Immature Granulocytes: 0.05 10*3/uL (ref 0.00–0.07)
Basophils Absolute: 0 10*3/uL (ref 0.0–0.1)
Basophils Relative: 0 %
Eosinophils Absolute: 0.1 10*3/uL (ref 0.0–0.5)
Eosinophils Relative: 1 %
HCT: 38.9 % (ref 36.0–46.0)
Hemoglobin: 12.7 g/dL (ref 12.0–15.0)
Immature Granulocytes: 1 %
Lymphocytes Relative: 36 %
Lymphs Abs: 2.8 10*3/uL (ref 0.7–4.0)
MCH: 29.7 pg (ref 26.0–34.0)
MCHC: 32.6 g/dL (ref 30.0–36.0)
MCV: 91.1 fL (ref 80.0–100.0)
Monocytes Absolute: 0.5 10*3/uL (ref 0.1–1.0)
Monocytes Relative: 7 %
Neutro Abs: 4.2 10*3/uL (ref 1.7–7.7)
Neutrophils Relative %: 55 %
Platelets: 218 10*3/uL (ref 150–400)
RBC: 4.27 MIL/uL (ref 3.87–5.11)
RDW: 13.5 % (ref 11.5–15.5)
WBC: 7.6 10*3/uL (ref 4.0–10.5)
nRBC: 0 % (ref 0.0–0.2)

## 2020-06-07 LAB — CBG MONITORING, ED: Glucose-Capillary: 109 mg/dL — ABNORMAL HIGH (ref 70–99)

## 2020-06-07 LAB — URINALYSIS, ROUTINE W REFLEX MICROSCOPIC
Bilirubin Urine: NEGATIVE
Glucose, UA: NEGATIVE mg/dL
Hgb urine dipstick: NEGATIVE
Ketones, ur: NEGATIVE mg/dL
Leukocytes,Ua: NEGATIVE
Nitrite: NEGATIVE
Protein, ur: NEGATIVE mg/dL
Specific Gravity, Urine: 1.008 (ref 1.005–1.030)
pH: 5 (ref 5.0–8.0)

## 2020-06-07 LAB — RAPID URINE DRUG SCREEN, HOSP PERFORMED
Amphetamines: NOT DETECTED
Barbiturates: NOT DETECTED
Benzodiazepines: NOT DETECTED
Cocaine: NOT DETECTED
Opiates: NOT DETECTED
Tetrahydrocannabinol: POSITIVE — AB

## 2020-06-07 LAB — ETHANOL: Alcohol, Ethyl (B): <10 mg/dL (ref <10)

## 2020-06-07 LAB — COMPREHENSIVE METABOLIC PANEL
ALT: 14 U/L (ref 0–44)
AST: 17 U/L (ref 15–41)
Albumin: 3.7 g/dL (ref 3.5–5.0)
Alkaline Phosphatase: 73 U/L (ref 38–126)
Anion gap: 13 (ref 5–15)
BUN: 9 mg/dL (ref 6–20)
CO2: 24 mmol/L (ref 22–32)
Calcium: 9.6 mg/dL (ref 8.9–10.3)
Chloride: 105 mmol/L (ref 98–111)
Creatinine, Ser: 0.72 mg/dL (ref 0.44–1.00)
GFR, Estimated: >60 mL/min (ref >60)
Glucose, Bld: 136 mg/dL — ABNORMAL HIGH (ref 70–99)
Potassium: 3.1 mmol/L — ABNORMAL LOW (ref 3.5–5.1)
Sodium: 142 mmol/L (ref 135–145)
Total Bilirubin: 0.2 mg/dL — ABNORMAL LOW (ref 0.3–1.2)
Total Protein: 6.6 g/dL (ref 6.5–8.1)

## 2020-06-07 LAB — RESP PANEL BY RT-PCR (FLU A&B, COVID) ARPGX2
Influenza A by PCR: NEGATIVE
Influenza B by PCR: NEGATIVE
SARS Coronavirus 2 by RT PCR: NEGATIVE

## 2020-06-07 LAB — TROPONIN I (HIGH SENSITIVITY): Troponin I (High Sensitivity): 6 ng/L (ref <18)

## 2020-06-07 MED ORDER — LORAZEPAM 2 MG/ML IJ SOLN
INTRAMUSCULAR | Status: AC
  Filled 2020-06-07: qty 1

## 2020-06-07 MED ORDER — SODIUM CHLORIDE 0.9 % IV BOLUS
500.0000 mL | Freq: Once | INTRAVENOUS | Status: AC
  Administered 2020-06-07: 500 mL via INTRAVENOUS

## 2020-06-07 MED ORDER — LORAZEPAM 2 MG/ML IJ SOLN
2.0000 mg | Freq: Once | INTRAMUSCULAR | Status: AC
  Administered 2020-06-07: 2 mg via INTRAVENOUS

## 2020-06-07 MED ORDER — LACTATED RINGERS IV BOLUS
1000.0000 mL | Freq: Once | INTRAVENOUS | Status: AC
  Administered 2020-06-08: 1000 mL via INTRAVENOUS

## 2020-06-07 NOTE — ED Provider Notes (Signed)
Emergency Department Provider Note   I have reviewed the triage vital signs and the nursing notes.   HISTORY  Chief Complaint Seizures   HPI Kathy [name] is a 54 y.o. female with PMH of chronic back pain and knee pain presents to the ED with seizure like activity. The patient was apparently driving with her boyfriend and suddenly "began shaking and kicking" and was unresponsive for "5 to 8 minutes" according to the boyfriend. She has no known history of seizure. He tells me that today she was complaining of some chest pain and nausea. EMS arrived to find the patient making eye contact and following some commands but seemed drowsy and continued to have rhythmic shaking/patting of the left hand against her thigh. He report an approximately 10 sec run of V tach with the patient holding her chest area. No loss of pulses or CPR required. Patient is not speaking on arrival.   Level 5 caveat: Decreased alertness on arrival.   Past Medical History:  Diagnosis Date  . Degeneration of lumbar or lumbosacral intervertebral disc   . Disorders of sacrum   . Enthesopathy of hip region   . Facet syndrome, lumbar   . Myalgia and myositis, unspecified   . Unequal leg length (acquired)     Patient Active Problem List   Diagnosis Date Noted  . Seizure (HCC) 06/08/2020  . Acute encephalopathy 06/08/2020  . Hypokalemia 06/08/2020  . Essential hypertension 06/08/2020  . Myalgia and myositis, unspecified 11/08/2011  . Tarsal tunnel syndrome 11/08/2011  . Lumbar spondylosis 11/08/2011  . MORBID OBESITY 09/11/2007  . Generalized anxiety disorder 09/11/2007  . DEPRESSION 09/11/2007  . POLYARTHRALGIA 09/11/2007  . CHOLECYSTITIS, CHRONIC 07/20/2007  . BACK PAIN, THORACIC REGION 07/20/2007  . THORACIC/LUMBOSACRAL NEURITIS/RADICULITIS UNSPEC 07/20/2007  . NEPHROLITHIASIS, HX OF 07/20/2007  . ACUTE URIS OF UNSPECIFIED SITE 05/22/2007  . Acquired hypothyroidism 04/12/2007  . HYPERPROLACTINEMIA  04/12/2007  . HYPERLIPIDEMIA 04/12/2007  . Anemia, unspecified 04/12/2007  . CHRONIC FATIGUE SYNDROME 04/12/2007  . HYPERGLYCEMIA 04/12/2007  . 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 ABDOMINAL HYSTERECTOMY    . CARPAL TUNNEL RELEASE    . CHOLECYSTECTOMY    . FOOT SURGERY    . HERNIA REPAIR    . KIDNEY STONE SURGERY Left 09-26-12    Allergies Amoxicillin, Codeine, Hydrocodone-acetaminophen, Ms contin [morphine], Amitriptyline, Latex, and Tramadol  Family History  Problem Relation Age of Onset  . Heart disease Mother   . Diabetes Mother   . Hypertension Mother   . Heart disease Brother   . Diabetes Brother   . Hypertension Brother   . Heart disease Brother   . Diabetes Brother   . Hypertension Brother     Social History Social History   Tobacco Use  . Smoking status: Never Smoker  . Smokeless tobacco: Never Used  Substance Use Topics  . Alcohol use: No  . Drug use: No    Review of Systems  Level 5 caveat: AMS ____________________________________________   PHYSICAL EXAM:  VITAL SIGNS: ED Triage Vitals [06/07/20 2002]  Enc Vitals Group     BP (!) 156/78     Pulse Rate 98     Resp 16     Temp 97.6 F (36.4 C)     Temp src      SpO2 94 %   Constitutional: Alert and grunting in response to questions. Intermittently following commands. Rhythmically tapping left hand to  left thigh.  Eyes: Conjunctivae are normal. PERRL. EOMI. Head: Atraumatic. Nose: No congestion/rhinnorhea. Mouth/Throat: Mucous membranes are moist.  Neck: No stridor.   Cardiovascular: Normal rate, regular rhythm. Good peripheral circulation. Grossly normal heart sounds.   Respiratory: Normal respiratory effort.  No retractions. Lungs CTAB. Gastrointestinal: Soft and nontender. No distention.  Musculoskeletal: No lower extremity tenderness nor edema. No gross deformities of extremities. Neurologic: Unable to assess speech.  Patient participating with exam and occasionally pointing to chest with left arm which continues to shake. No gaze deviation. No additional repetitive movements.  Skin:  Skin is warm, dry and intact. No rash noted.  ____________________________________________   LABS (all labs ordered are listed, but only abnormal results are displayed)  Labs Reviewed  COMPREHENSIVE METABOLIC PANEL - Abnormal; Notable for the following components:      Result Value   Potassium 3.1 (*)    Glucose, Bld 136 (*)    Total Bilirubin 0.2 (*)    All other components within normal limits  RAPID URINE DRUG SCREEN, HOSP PERFORMED - Abnormal; Notable for the following components:   Tetrahydrocannabinol POSITIVE (*)    All other components within normal limits  URINALYSIS, ROUTINE W REFLEX MICROSCOPIC - Abnormal; Notable for the following components:   Color, Urine STRAW (*)    APPearance HAZY (*)    All other components within normal limits  MAGNESIUM - Abnormal; Notable for the following components:   Magnesium 1.6 (*)    All other components within normal limits  COMPREHENSIVE METABOLIC PANEL - Abnormal; Notable for the following components:   Potassium 3.4 (*)    Total Protein 6.1 (*)    Albumin 3.2 (*)    All other components within normal limits  CBC - Abnormal; Notable for the following components:   Hemoglobin 11.8 (*)    HCT 35.8 (*)    All other components within normal limits  CBG MONITORING, ED - Abnormal; Notable for the following components:   Glucose-Capillary 109 (*)    All other components within normal limits  RESP PANEL BY RT-PCR (FLU A&B, COVID) ARPGX2  ETHANOL  CBC WITH DIFFERENTIAL/PLATELET  MAGNESIUM  PHOSPHORUS  CK  CK  HIV ANTIBODY (ROUTINE TESTING W REFLEX)  TSH  PHOSPHORUS  PROLACTIN  TROPONIN I (HIGH SENSITIVITY)  TROPONIN I (HIGH SENSITIVITY)   ____________________________________________  EKG   EKG Interpretation  Date/Time:  Sunday June 07 2020 19:58:57  EST Ventricular Rate:  89 PR Interval:    QRS Duration: 95 QT Interval:  360 QTC Calculation: 438 R Axis:   15 Text Interpretation: Sinus rhythm Consider left atrial enlargement Abnormal R-wave progression, early transition No STEMI Confirmed by Alona Bene [phone]) on 06/07/2020 8:13:32 PM Also confirmed by Alona Bene [phone]), editor Erenest Rasher ([id number])  on 06/08/2020 7:08:50 AM       ____________________________________________  RADIOLOGY  CT Head Wo Contrast  Result Date: 06/07/2020 CLINICAL DATA:  Mental status change of unknown cause. EXAM: CT HEAD WITHOUT CONTRAST TECHNIQUE: Contiguous axial images were obtained from the base of the skull through the vertex without intravenous contrast. COMPARISON:  02/21/2013 FINDINGS: Brain: No evidence of acute infarction, hemorrhage, hydrocephalus, extra-axial collection or mass lesion/mass effect. Vascular: No hyperdense vessel or unexpected calcification. Skull: Normal. Negative for fracture or focal lesion. Sinuses/Orbits: Globes and orbits are unremarkable. Sinuses are clear. Other: None. IMPRESSION: Normal unenhanced CT scan of the brain. Electronically Signed   By: Amie Portland M.D.   On: 06/07/2020 20:39   DG Chest Portable 1  View  Result Date: 06/07/2020 CLINICAL DATA:  Rapid shallow breathing. Unresponsive to breathing instructions. EMS called for seizure like activity. Found in car by EMS having seizure like activity. EXAM: PORTABLE CHEST 1 VIEW COMPARISON:  06/12/2014. FINDINGS: Cardiac silhouette normal in size.  No mediastinal or hilar masses. Lung volumes are low. Lungs are clear. No convincing pleural effusion and no pneumothorax. Skeletal structures are grossly intact. IMPRESSION: No active disease. Electronically Signed   By: Amie Portland M.D.   On: 06/07/2020 20:20    ____________________________________________   PROCEDURES  Procedure(s) performed:    Procedures   ____________________________________________   INITIAL IMPRESSION / ASSESSMENT AND PLAN / ED COURSE  Pertinent labs & imaging results that were available during my care of the patient were reviewed by me and considered in my medical decision making (see chart for details).   Patient arrives to the ED with question new onset seizure activity. Patient with rhythmic tapping of the left thigh with left hand. Question partial seizure on arrival and 2 mg of Ativan given which stopped the movement. I suspect that the patient's rhythmic tapping was giving the V-tach pattern reported by EMS. She was showing some of that activity here on monitor prior to ativan and when her hand was held the rhythm returned to normal. Suspect artifact. CBG normal. No indication for airway protection. No obvious seizure activity after ativan. Will perform CT head, labs, and reassess. EKG interpreted by me as above.   CT imaging largely unremarkable. Labs reviewed. Discussed case with Dr. Iver Nestle who will consult and requests MRI. Patient following commands and hand movements have stopped. No V-tach on tele here. Continues to not respond verbally but otherwise gesturing and following commands briskly. Low suspicion for subclinical status.   Care transferred to Dr. Clayborne Dana. Patient continues to be non-verbal.  ____________________________________________  FINAL CLINICAL IMPRESSION(S) / ED DIAGNOSES  Final diagnoses:  Seizure-like activity (HCC)     MEDICATIONS GIVEN DURING THIS VISIT:  Medications  sodium chloride 0.9 % bolus 500 mL (0 mLs Intravenous Stopped 06/07/20 2229)  LORazepam (ATIVAN) injection 2 mg (2 mg Intravenous Given 06/07/20 1956)  lactated ringers bolus 1,000 mL (0 mLs Intravenous Stopped 06/08/20 0215)  potassium chloride SA (KLOR-CON) CR tablet 40 mEq (40 mEq Oral Given 06/08/20 0625)  magnesium sulfate IVPB 2 g 50 mL (0 g Intravenous Stopped 06/08/20 0956)  acetaminophen (TYLENOL)  tablet 650 mg (650 mg Oral Given 06/09/20 1101)     Note:  This document was prepared using Dragon voice recognition software and may include unintentional dictation errors.  Alona Bene, MD, [facility]    Lizzett Nobile, Arlyss Repress, MD 06/10/20 [phone]

## 2020-06-07 NOTE — ED Triage Notes (Signed)
Pt arrived by EMS for seizure like activity. EMS called to gas station and found pt in the car with seizure like activity to L arm and hand. Pt nonverbal on arrival, follows commands, reported to EMS chest pain. Per EMS report, pt had 10 second episode of VTACH. IV placed to L foot pta

## 2020-06-07 NOTE — ED Provider Notes (Signed)
11:01 PM Assumed care from Dr. Jacqulyn Bath, please see their note for full history, physical and decision making until this point. In brief this is a 54 y.o. year old female who presented to the ED tonight with Seizures     Pending neurology consult and MRI for final disposition.   MRI ok. Neurology recommends EEG and LP if becomes febrile (isn't now). D/w hospitalist. Admitted.    Labs, studies and imaging reviewed by myself and considered in medical decision making if ordered. Imaging interpreted by radiology.  Labs Reviewed  COMPREHENSIVE METABOLIC PANEL - Abnormal; Notable for the following components:      Result Value   Potassium 3.1 (*)    Glucose, Bld 136 (*)    Total Bilirubin 0.2 (*)    All other components within normal limits  RAPID URINE DRUG SCREEN, HOSP PERFORMED - Abnormal; Notable for the following components:   Tetrahydrocannabinol POSITIVE (*)    All other components within normal limits  URINALYSIS, ROUTINE W REFLEX MICROSCOPIC - Abnormal; Notable for the following components:   Color, Urine STRAW (*)    APPearance HAZY (*)    All other components within normal limits  CBG MONITORING, ED - Abnormal; Notable for the following components:   Glucose-Capillary 109 (*)    All other components within normal limits  RESP PANEL BY RT-PCR (FLU A&B, COVID) ARPGX2  ETHANOL  CBC WITH DIFFERENTIAL/PLATELET  TROPONIN I (HIGH SENSITIVITY)  TROPONIN I (HIGH SENSITIVITY)    CT Head Wo Contrast  Final Result    DG Chest Portable 1 View  Final Result    MR BRAIN WO CONTRAST    (Results Pending)    No follow-ups on file.    Oaklan Persons, Barbara Cower, MD 06/08/20 [phone]

## 2020-06-07 NOTE — ED Notes (Signed)
Pt taken to MRI  

## 2020-06-07 NOTE — ED Notes (Signed)
Pt returned from MRI. Resting comfortably

## 2020-06-07 NOTE — Consult Note (Signed)
Neurology Consultation Reason for Consult: Altered Mental Status, concern for seizure Requesting Physician: Marily Memos  CC: Shaking and unresponsiveness   History is obtained from: Friend / self-identified boyfriend Kejuan Shufore at bedside   HPI: Kathy [name] is a 54 y.o. female, left hand dominant or ambidextrous, with a past medical history significant for lumbar spine degenerative disc disease, anxiety, chronic daily headache, chronic fatigue syndrome, fibromyalgia.  Mr. Kathie Rhodes reports that the patient was in her normal state of health this week.  However he noticed at dinner that she was having trouble using the tongs to put salad on her plate with her left hand.  She reported that her hand felt stiff and painful.  She continued to have this difficulty with her hand.  He did not observe any shaking movements.  She did not complain of any progression of the symptoms into her arm or shoulder.  She did not appear to have any issues with her speech or language.  However at about 7:30 PM while he was driving, she began to have generalized shaking activity which she feels lasted for 5 to 8 minutes.  She was then unresponsive for a similar period of time.  On EMS arrival she was making eye contact and following some commands but seemed drowsy and continued to have some rhythmic movements of her left hand.  These movements were also observed by the ED provider, who felt that the report of V. tach was actually artifact from her hand movements.  He reports that that when he tried to suppress the movements he felt that they were not fully suppressible although he was able to hold her hand still enough to get a better EKG.  During these movements she was still able to use the left hand to point at things such as her chest, reporting chest pain.  She did receive 2 mg of Ativan in the ED at about 8 PM with improvements of her movements though subsequently she was quite somnolent.  Thus far ED work-up has included  mildly elevated glucose (109), negative respiratory viral panel, negative UA, and U tox positive only for THC (negative for opiates and benzos which are both on her home medication list).  Additionally she was mildly hypokalemic to 3.1, CMP is otherwise unremarkable but magnesium and phosphorus have not been checked.  Mr. Kathie Rhodes reports that he and patient have been dating for 8 years and that they live together and sleep in the same bed.  He denies any episodes of nocturnal shaking, bedwetting, fecal incontinence, tongue biting.  He reports that she takes [facility] powder daily for her headaches but has not been complaining of worse headaches lately.  He reports that she takes Xanax 1-2 times a week typically and oxycodone 1-2 times a week typically and again he has not noticed any increase in this medication use recently.  He denies noticing any signs and symptoms of infection recently.  He reports the patient has no known history of meningitis or significant head trauma.  He is unsure of her birth/developmental history.  She has been working in home health care and has not had any difficulties in her job lately.  He reports that she does not use any substances including marijuana (I did not disclose positive THC testing on UDS to him)  ROS: Unable to obtain due to altered mental status, obtained as above from friend at bedside  Past Medical History:  Diagnosis Date  . Degeneration of lumbar or lumbosacral intervertebral disc   .  Disorders of sacrum   . Enthesopathy of hip region   . Facet syndrome, lumbar   . Myalgia and myositis, unspecified   . Unequal leg length (acquired)    Past Surgical History:  Procedure Laterality Date  . ABDOMINAL HYSTERECTOMY    . CARPAL TUNNEL RELEASE    . CHOLECYSTECTOMY    . FOOT SURGERY    . HERNIA REPAIR    . KIDNEY STONE SURGERY Left 09-26-12   Current Outpatient Medications  Medication Instructions  . ALPRAZolam (XANAX) 1 mg, Oral, 2 times daily  . betamethasone  valerate lotion (VALISONE) 0.1 % 1 application, As needed  . diclofenac sodium (VOLTAREN) 1 % GEL 1 application, 3 times daily  . ergocalciferol (VITAMIN D2) 50,000 Units, 2 times weekly  . hydrochlorothiazide (MICROZIDE) 12.5 MG capsule 1 tablet, Daily  . ketoconazole (NIZORAL) 2 % cream 1 application, As needed  . Linaclotide (LINZESS) 290 MCG CAPS 1 capsule, Daily  . methocarbamol (ROBAXIN) 500 mg, Oral, 3 times daily  . metoprolol succinate (TOPROL-XL) 25 MG 24 hr tablet 1 tablet, Daily  . oxyCODONE (ROXICODONE) 15 mg, Oral, As directed, 1st week oxycodone 15mg  tid, #21<BR>2nd week tid, #21<BR>3rd week bid, #14<BR>4th week one daily, #7  . oxymorphone (OPANA ER) 20 mg, Oral, Daily  . PATANOL 0.1 % ophthalmic solution No dose, route, or frequency recorded.  PEG 3350-KCl-NaBcb-NaCl-NaSulf (PEG-3350/ELECTROLYTES) 236 G SOLR No dose, route, or frequency recorded.  . sertraline (ZOLOFT) 50 MG tablet 1 tablet, Daily  . triamcinolone ointment (KENALOG) 0.1 % 1 application, 2 times daily PRN  . valACYclovir (VALTREX) 500 MG tablet 1 tablet, Daily     Family History  Problem Relation Age of Onset  . Heart disease Mother   . Diabetes Mother   . Hypertension Mother   . Heart disease Brother   . Diabetes Brother   . Hypertension Brother   . Heart disease Brother   . Diabetes Brother   . Hypertension Brother     Social History:  reports that she has never smoked. She has never used smokeless tobacco. She reports that she does not drink alcohol and does not use drugs.   Exam: Current vital signs: BP 135/84   Pulse 93   Temp 97.6 F (36.4 C)   Resp (!) 24   SpO2 99%  Vital signs in last 24 hours: Temp:  [97.6 F (36.4 C)] 97.6 F (36.4 C) (12/05 2002) Pulse Rate:  [93-108] 93 (12/05 2300) Resp:  [16-25] 24 (12/05 2300) BP: (119-156)/(63-84) 135/84 (12/05 2300) SpO2:  [94 %-100 %] 99 % (12/05 2300)   Physical Exam  Constitutional: Appears well-developed and well-nourished.   Psych: Obtunded, minimally interactive Eyes: No scleral injection HENT: No OP obstruction, fair dentition, no clear tongue laceration though she will not fully open her mouth or protrude her tongue.  No neck stiffness. MSK: no joint deformities.  Cardiovascular: Normal rate and regular rhythm.  Respiratory: Effort normal, non-labored breathing GI: Soft.  No distension. There is no tenderness.  Skin: WDI  Neuro: Mental Status: Patient is extremely somnolent, requiring repeated noxious stimulation to alert.  She is able to follow commands such as showing me 2 fingers. Cranial Nerves: II: Pupils are equal, round, and reactive to light.  6 to 3 mm, sluggish bilaterally III,IV, VI: Able to look bilaterally V: Facial sensation is symmetric to eyelash brush VII: Facial movement is symmetric.  VIII: hearing is intact to voice X: Uvula cannot be visualized XI: Unable to assess secondary  to patient's mental status  XII: tongue is midline up to the point of her teeth; she does not protrude it much further Motor: Tone is low throughout.  Bulk appears normal.  With significant coaching she is able to maintain bilateral upper extremities antigravity with some mild pronation without drift that is symmetric.  She is able to maintain bilateral lower extremities antigravity for at least 5 seconds again with significant coaching and initial noxious stimulation to awaken her. Sensory: She appears equally responsive to noxious stimulation in all 4 extremities. Deep Tendon Reflexes: 3+ and symmetric in the biceps and patellae.  There is no ankle clonus bilaterally Plantars: Toes are downgoing bilaterally.  Cerebellar: Unable to assess secondary to patient's mental status   I have reviewed labs in epic and the results pertinent to this consultation are: U tox notable for THC, notably negative for opiates and benzodiazepines (both on her home medication list) CMP with mildly increased glucose (136),  creatinine 0.72, GFR greater than 60, mild hypokalemia to 3.1, CBC unremarkable  I have reviewed the images obtained: Head CT: Unremarkable MRI brain -- no acute infarct. No hemorrhages. Motion limited coronal sequences especially; within that limit no clear hippocampal sclerosis   Impression: This is a 54 year old woman presenting with likely first-time seizure.  It is unclear to me whether she was having continued focal seizure activity in the left hand on initial ED arrival versus postictal Todds sensory symptoms leading to her being more fidgety with that hand/arm.  At this time she is too somnolent to provide history about whether any similar events may have happened in the past that her partner is unaware of.  Given that she does not seem to be continuing to seize, will hold on antiseizure medication at this time and complete seizure work-up with routine EEG tomorrow.  At this time I have low concern for infection, but should she become febrile emergent lumbar puncture and empiric meningitis coverage would be indicated  Recommendations: -Added on magnesium, phosphorus, CK -Trend CK -MRI brain w/o contrast  -Routine EEG  -Hold on starting AED until additional history is obtained from the patient tomorrow when her mental status clears; would favor Vimpat over Keppra given her history of anxiety -Should she have additional seizure history tonight before returning to her baseline, would give 200 mg IV Vimpat followed by standing dose of 100 mg twice daily -If she becomes febrile, please obtain urgent lumbar puncture and start empiric meningitis coverage with vancomycin, ceftriaxone, ampicillin and acyclovir -Counsel patient on seizure precautions and avoiding THC when her mental status clears -Standard seizure precautions: Per [employer], patients with seizures are not allowed to drive until  they have been seizure-free for six months. Use caution when using heavy equipment or  power tools. Avoid working on ladders or at heights. Take showers instead of baths. Ensure the water temperature is not too high on the home water heater. Do not go swimming alone. When caring for infants or small children, sit down when holding, feeding, or changing them to minimize risk of injury to the child in the event you have a seizure.  To reduce risk of seizures, maintain good sleep hygiene avoid alcohol and illicit drug use, take all anti-seizure medications as prescribed.  -Neurology will continue to follow  Brooke Dare MD-PhD [facility] [phone]

## 2020-06-08 ENCOUNTER — Encounter (HOSPITAL_COMMUNITY): Payer: Self-pay | Admitting: Internal Medicine

## 2020-06-08 ENCOUNTER — Other Ambulatory Visit: Payer: Self-pay

## 2020-06-08 ENCOUNTER — Inpatient Hospital Stay (HOSPITAL_COMMUNITY): Payer: Medicare Other

## 2020-06-08 DIAGNOSIS — Z87442 Personal history of urinary calculi: Secondary | ICD-10-CM | POA: Diagnosis not present

## 2020-06-08 DIAGNOSIS — M5136 Other intervertebral disc degeneration, lumbar region: Secondary | ICD-10-CM | POA: Diagnosis present

## 2020-06-08 DIAGNOSIS — G9340 Encephalopathy, unspecified: Secondary | ICD-10-CM | POA: Diagnosis present

## 2020-06-08 DIAGNOSIS — F411 Generalized anxiety disorder: Secondary | ICD-10-CM

## 2020-06-08 DIAGNOSIS — R569 Unspecified convulsions: Secondary | ICD-10-CM

## 2020-06-08 DIAGNOSIS — I10 Essential (primary) hypertension: Secondary | ICD-10-CM | POA: Diagnosis present

## 2020-06-08 DIAGNOSIS — E039 Hypothyroidism, unspecified: Secondary | ICD-10-CM | POA: Diagnosis present

## 2020-06-08 DIAGNOSIS — G43909 Migraine, unspecified, not intractable, without status migrainosus: Secondary | ICD-10-CM | POA: Diagnosis present

## 2020-06-08 DIAGNOSIS — Z8249 Family history of ischemic heart disease and other diseases of the circulatory system: Secondary | ICD-10-CM | POA: Diagnosis not present

## 2020-06-08 DIAGNOSIS — E876 Hypokalemia: Secondary | ICD-10-CM | POA: Diagnosis present

## 2020-06-08 DIAGNOSIS — Z885 Allergy status to narcotic agent status: Secondary | ICD-10-CM | POA: Diagnosis not present

## 2020-06-08 DIAGNOSIS — M47816 Spondylosis without myelopathy or radiculopathy, lumbar region: Secondary | ICD-10-CM | POA: Diagnosis present

## 2020-06-08 DIAGNOSIS — G9341 Metabolic encephalopathy: Secondary | ICD-10-CM | POA: Diagnosis present

## 2020-06-08 DIAGNOSIS — Z881 Allergy status to other antibiotic agents status: Secondary | ICD-10-CM | POA: Diagnosis not present

## 2020-06-08 DIAGNOSIS — F32A Depression, unspecified: Secondary | ICD-10-CM | POA: Diagnosis present

## 2020-06-08 DIAGNOSIS — Z20822 Contact with and (suspected) exposure to covid-19: Secondary | ICD-10-CM | POA: Diagnosis present

## 2020-06-08 DIAGNOSIS — Z9049 Acquired absence of other specified parts of digestive tract: Secondary | ICD-10-CM | POA: Diagnosis not present

## 2020-06-08 DIAGNOSIS — Z79899 Other long term (current) drug therapy: Secondary | ICD-10-CM | POA: Diagnosis not present

## 2020-06-08 DIAGNOSIS — Z91040 Latex allergy status: Secondary | ICD-10-CM | POA: Diagnosis not present

## 2020-06-08 LAB — PHOSPHORUS
Phosphorus: 3.4 mg/dL (ref 2.5–4.6)
Phosphorus: 4.1 mg/dL (ref 2.5–4.6)

## 2020-06-08 LAB — COMPREHENSIVE METABOLIC PANEL
ALT: 15 U/L (ref 0–44)
AST: 16 U/L (ref 15–41)
Albumin: 3.2 g/dL — ABNORMAL LOW (ref 3.5–5.0)
Alkaline Phosphatase: 78 U/L (ref 38–126)
Anion gap: 9 (ref 5–15)
BUN: 10 mg/dL (ref 6–20)
CO2: 27 mmol/L (ref 22–32)
Calcium: 9.6 mg/dL (ref 8.9–10.3)
Chloride: 106 mmol/L (ref 98–111)
Creatinine, Ser: 0.64 mg/dL (ref 0.44–1.00)
GFR, Estimated: >60 mL/min (ref >60)
Glucose, Bld: 94 mg/dL (ref 70–99)
Potassium: 3.4 mmol/L — ABNORMAL LOW (ref 3.5–5.1)
Sodium: 142 mmol/L (ref 135–145)
Total Bilirubin: 0.4 mg/dL (ref 0.3–1.2)
Total Protein: 6.1 g/dL — ABNORMAL LOW (ref 6.5–8.1)

## 2020-06-08 LAB — CBC
HCT: 35.8 % — ABNORMAL LOW (ref 36.0–46.0)
Hemoglobin: 11.8 g/dL — ABNORMAL LOW (ref 12.0–15.0)
MCH: 30.2 pg (ref 26.0–34.0)
MCHC: 33 g/dL (ref 30.0–36.0)
MCV: 91.6 fL (ref 80.0–100.0)
Platelets: 218 10*3/uL (ref 150–400)
RBC: 3.91 MIL/uL (ref 3.87–5.11)
RDW: 13.4 % (ref 11.5–15.5)
WBC: 8.2 10*3/uL (ref 4.0–10.5)
nRBC: 0 % (ref 0.0–0.2)

## 2020-06-08 LAB — TSH: TSH: 1.397 u[IU]/mL (ref 0.350–4.500)

## 2020-06-08 LAB — MAGNESIUM
Magnesium: 1.7 mg/dL (ref 1.7–2.4)
Magnesium: 1.6 mg/dL — ABNORMAL LOW (ref 1.7–2.4)

## 2020-06-08 LAB — TROPONIN I (HIGH SENSITIVITY): Troponin I (High Sensitivity): 8 ng/L (ref <18)

## 2020-06-08 LAB — CK
Total CK: 65 U/L (ref 38–234)
Total CK: 89 U/L (ref 38–234)

## 2020-06-08 LAB — HIV ANTIBODY (ROUTINE TESTING W REFLEX): HIV Screen 4th Generation wRfx: NONREACTIVE

## 2020-06-08 MED ORDER — POTASSIUM CHLORIDE 10 MEQ/100ML IV SOLN
10.0000 meq | INTRAVENOUS | Status: DC
Start: 2020-06-08 — End: 2020-06-08

## 2020-06-08 MED ORDER — CYANOCOBALAMIN 1000 MCG/ML IJ SOLN: 1000.0000 ug | INTRAMUSCULAR | Status: DC

## 2020-06-08 MED ORDER — POTASSIUM CHLORIDE 10 MEQ/100ML IV SOLN: 10.0000 meq | INTRAVENOUS | Status: DC

## 2020-06-08 MED ORDER — AMLODIPINE BESYLATE 5 MG PO TABS
5.0000 mg | ORAL_TABLET | Freq: Every day | ORAL | Status: DC
  Administered 2020-06-08 – 2020-06-09 (×2): 5 mg via ORAL
  Filled 2020-06-08 (×2): qty 1

## 2020-06-08 MED ORDER — EMPAGLIFLOZIN 10 MG PO TABS
10.0000 mg | ORAL_TABLET | Freq: Every day | ORAL | Status: DC
  Administered 2020-06-09: 10 mg via ORAL
  Filled 2020-06-08: qty 1

## 2020-06-08 MED ORDER — SODIUM CHLORIDE 0.9 % IV SOLN: INTRAVENOUS | Status: DC | PRN

## 2020-06-08 MED ORDER — GABAPENTIN 300 MG PO CAPS
300.0000 mg | ORAL_CAPSULE | Freq: Every evening | ORAL | Status: DC | PRN
  Administered 2020-06-08: 900 mg via ORAL
  Filled 2020-06-08: qty 3

## 2020-06-08 MED ORDER — HYDROCHLOROTHIAZIDE 12.5 MG PO CAPS
12.5000 mg | ORAL_CAPSULE | Freq: Every day | ORAL | Status: DC
  Administered 2020-06-08 – 2020-06-09 (×2): 12.5 mg via ORAL
  Filled 2020-06-08 (×2): qty 1

## 2020-06-08 MED ORDER — VALACYCLOVIR HCL 500 MG PO TABS
1000.0000 mg | ORAL_TABLET | Freq: Every day | ORAL | Status: DC
  Administered 2020-06-09: 1000 mg via ORAL
  Filled 2020-06-08: qty 2

## 2020-06-08 MED ORDER — POTASSIUM CHLORIDE CRYS ER 20 MEQ PO TBCR
40.0000 meq | EXTENDED_RELEASE_TABLET | Freq: Once | ORAL | Status: AC
  Administered 2020-06-08: 40 meq via ORAL
  Filled 2020-06-08: qty 2

## 2020-06-08 MED ORDER — ONDANSETRON HCL 4 MG/2ML IJ SOLN: 4.0000 mg | Freq: Four times a day (QID) | INTRAMUSCULAR | Status: DC | PRN

## 2020-06-08 MED ORDER — IRBESARTAN 150 MG PO TABS
150.0000 mg | ORAL_TABLET | Freq: Every day | ORAL | Status: DC
  Administered 2020-06-08 – 2020-06-09 (×2): 150 mg via ORAL
  Filled 2020-06-08 (×2): qty 1

## 2020-06-08 MED ORDER — SEMAGLUTIDE (1 MG/DOSE) 2 MG/1.5ML ~~LOC~~ SOPN: 1.0000 mg | PEN_INJECTOR | SUBCUTANEOUS | Status: DC

## 2020-06-08 MED ORDER — AMLODIPINE-VALSARTAN-HCTZ 5-160-12.5 MG PO TABS: 1.0000 | ORAL_TABLET | Freq: Every day | ORAL | Status: DC

## 2020-06-08 MED ORDER — ENOXAPARIN SODIUM 40 MG/0.4ML ~~LOC~~ SOLN
40.0000 mg | SUBCUTANEOUS | Status: DC
  Administered 2020-06-08 – 2020-06-09 (×2): 40 mg via SUBCUTANEOUS
  Filled 2020-06-08 (×2): qty 0.4

## 2020-06-08 MED ORDER — VITAMIN D 25 MCG (1000 UNIT) PO TABS
5000.0000 [IU] | ORAL_TABLET | Freq: Every day | ORAL | Status: DC
  Administered 2020-06-09: 5000 [IU] via ORAL
  Filled 2020-06-08: qty 5

## 2020-06-08 MED ORDER — MAGNESIUM SULFATE 2 GM/50ML IV SOLN
2.0000 g | Freq: Once | INTRAVENOUS | Status: AC
  Administered 2020-06-08: 2 g via INTRAVENOUS
  Filled 2020-06-08: qty 50

## 2020-06-08 NOTE — Progress Notes (Signed)
EEG complete - results pending 

## 2020-06-08 NOTE — ED Notes (Signed)
Called 3W informed RN busy and RN will call back.

## 2020-06-08 NOTE — H&P (Signed)
History and Physical    PLEASE NOTE THAT DRAGON DICTATION SOFTWARE WAS USED IN THE CONSTRUCTION OF THIS NOTE.   CONSEPCION UTT [name]:[id number] DOB: February 25, 1966 DOA: 06/07/2020  PCP: Renard Hamper, MD Patient coming from: home   I have personally briefly reviewed patient's old medical records in [facility]  Chief Complaint: shaking activity   HPI: Kathy [name] is a 54 y.o. female with medical history significant for hypertension, acquired hypothyroidism, chronic pain syndrome, chronic fatigue syndrome, generalized anxiety disorder, depression, degenerative disc disease of the lumbar spine, who is admitted to [facility] on 06/08/2020 with suspected seizure activity after presenting from home via EMS to [facility] Emergency Department for evaluation of shaking activity.  In the context the patient's current altered mental status, the following history is provided by the patient's boyfriend, who is present at bedside, in addition to my discussions with the emergency department physician, and via chart review.  In the context of no known history of prior seizure activity, the patient reportedly exhibited evidence of generalized shaking activity involving all 4 extremities around 1930 on 06/07/2020.  Today's episode reportedly lasted approximately 5 to 7 minutes, and was not associated with any bowel/bladder incontinence or any tongue biting.  The patient's boyfriend reports that the patient exhibited diminished responsiveness following this episode.  He believes that the generalized shaking activity completely resolved, before the patient was noted to exhibit shaking activity in her left upper extremity upon arrival at [facility] emergency department.  At that time she received Ativan 2 mg IV x1 at approximately 2000, following which the shaking completely resolved, without reported subsequent recurrence.   No known recent subjective fever, chills, rigors, or generalized  myalgias.  No recent known trauma.  Patient's boyfriend reports that the patient has a history of chronic headaches, although he does not believe that they are migraines.  She has reportedly been experiencing an increasing frequency of these headaches, such that recently she has been experiencing them at least once per day, without known associated aura.  No known recent neck stiffness, shortness of breath, nausea, vomiting, abdominal pain, diarrhea, or rash.  No recent traveling or known COVID-19 exposures.  The patient's boyfriend reports that the patient has not recently complained of any chest pain, diaphoresis, or palpitations.  In the setting of a history of generalized anxiety disorder, boyfriend conveys that the patient takes as needed Xanax 1-2 times per week.  Otherwise, the patient is not on any scheduled or as needed benzodiazepines.  It appears that the patient is also on Zoloft at home.  No known history of underlying diabetes, and no use of hypoglycemia inducing agents at home.    ED Course:  Vital signs in the ED were notable for the following: Temperature max 97.6; heart rate 72-98; blood pressure 120/67 - 156/78; respiratory rate 16-21; oxygen saturation 97 to 99% on room air.  Labs were notable for the following: CMP was notable for the following: Sodium 142, potassium 3.1, bicarbonate 24, BUN 9, creatinine 0.72, glucose 136.  Serum magnesium level 1.7.  Total CPK 65.  CBC was notable for the following: White blood cell count of 7600.  Urinalysis showed no white blood cells and was negative for leukocyte esterase as well as nitrates, and showed no evidence of hemoglobin.  Serum ethanol level was found to be less than 10.  Urinary drug screen was positive for Tedrohydrocannabinol, but was otherwise negative.  COVID-19/influenza PCR performed in the ED this  evening were found to be negative.  Chest x-ray showed evidence of acute cardiopulmonary process.  Noncontrast CT head showed no  evidence of acute intracranial process, including no evidence of acute intracranial hemorrhage or acute ischemic infarct.  EKG showed sinus rhythm with heart rate 89, QTc 438 ms, and no evidence of T wave or ST changes, including no evidence of ST elevation.  The patient's case was discussed with the on-call neurologist, Dr. Iver NestleBhagat, who will formally consult on this patient and has already evaluated the patient in person. Dr. Rollene FareBhagat's ensuing recommendations include EEG in the morning to be ordered by neurology, trend serial CPK level, check MRI brain.  Should the patient develop subsequent seizures, Dr. Iver NestleBhagat recommends Vimpat preferentially over Keppra.  Specifically, in the setting of subsequent seizure, she recommends Vimpat 200 mg IV x1 followed by 100 mg twice daily.  Additionally, if the patient subsequently becomes febrile, Dr. Iver NestleBhagat recommends pursuit of lumbar puncture as well as empiric meningitis coverage.  While in the ED, the following were administered: Ativan 2 mg IV x1.  Normal saline x500 cc bolus.  Lactated Ringer's x1 L bolus.    Review of Systems: As per HPI otherwise 10 point review of systems negative.   Past Medical History:  Diagnosis Date  . Degeneration of lumbar or lumbosacral intervertebral disc   . Disorders of sacrum   . Enthesopathy of hip region   . Facet syndrome, lumbar   . Myalgia and myositis, unspecified   . Unequal leg length (acquired)     Past Surgical History:  Procedure Laterality Date  . ABDOMINAL HYSTERECTOMY    . CARPAL TUNNEL RELEASE    . CHOLECYSTECTOMY    . FOOT SURGERY    . HERNIA REPAIR    . KIDNEY STONE SURGERY Left 09-26-12    Social History:  reports that she has never smoked. She has never used smokeless tobacco. She reports that she does not drink alcohol and does not use drugs.   Allergies  Allergen Reactions  . Codeine Hives  . Hydrocodone-Acetaminophen Hives  . Amoxicillin     REACTION: Edema, SOB  . Latex Rash     Family History  Problem Relation Age of Onset  . Heart disease Mother   . Diabetes Mother   . Hypertension Mother   . Heart disease Brother   . Diabetes Brother   . Hypertension Brother   . Heart disease Brother   . Diabetes Brother   . Hypertension Brother     Prior to Admission medications   Medication Sig Start Date End Date Taking? Authorizing Provider  ALPRAZolam Prudy Feeler(XANAX) 1 MG tablet Take 1 tablet (1 mg total) by mouth 2 (two) times daily. 09/21/12   Prueter, Clydie BraunKaren, PA-C  betamethasone valerate lotion (VALISONE) 0.1 % Apply 1 application topically as needed. 01/16/12   Renard HamperFisher, Michael J, MD  diclofenac sodium (VOLTAREN) 1 % GEL Apply 1 application topically 3 (three) times daily.    [provider]  ergocalciferol (VITAMIN D2) 50000 UNITS capsule Take 50,000 Units by mouth 2 (two) times a week.    [provider]  hydrochlorothiazide (MICROZIDE) 12.5 MG capsule Take 1 tablet by mouth Daily. 01/16/12   Renard HamperFisher, Michael J, MD  ketoconazole (NIZORAL) 2 % cream Apply 1 application topically as needed. 01/16/12   Renard HamperFisher, Michael J, MD  Linaclotide Karlene Einstein(LINZESS) 290 MCG CAPS Take 1 capsule by mouth daily.    [provider]  methocarbamol (ROBAXIN) 500 MG tablet Take 1 tablet (500  mg total) by mouth 3 (three) times daily. 05/28/12   Prueter, Clydie Braun, PA-C  metoprolol succinate (TOPROL-XL) 25 MG 24 hr tablet Take 1 tablet by mouth Daily. 11/28/11   Renard Hamper, MD  oxyCODONE (ROXICODONE) 15 MG immediate release tablet Take 1 tablet (15 mg total) by mouth as directed. 1st week oxycodone 15mg  tid, #21 2nd week tid, #21 3rd week bid, #14 4th week one daily, #7 11/20/12   Prueter, 11/22/12, PA-C  oxymorphone (OPANA ER) 20 MG 12 hr tablet Take 1 tablet (20 mg total) by mouth daily. 11/20/12   Prueter, 11/22/12, PA-C  PATANOL 0.1 % ophthalmic solution  07/26/12   [provider]  PEG 3350-KCl-NaBcb-NaCl-NaSulf (PEG-3350/ELECTROLYTES) 236 G SOLR  10/02/12   [provider]  sertraline (ZOLOFT) 50 MG tablet Take 1 tablet by mouth Daily. 01/16/12   01/18/12, MD  triamcinolone ointment (KENALOG) 0.1 % Apply 1 application topically Twice daily as needed. 04/28/12   [provider]  valACYclovir (VALTREX) 500 MG tablet Take 1 tablet by mouth Daily. 04/28/12   [provider]     Objective    Physical Exam: Vitals:   06/07/20 2348 06/08/20 0015 06/08/20 0115 06/08/20 0145  BP:  (!) 158/72 136/81 129/80  Pulse:  80 85 77  Resp:  (!) 21 20 16   Temp: (!) 97.1 F (36.2 C)     TempSrc: Temporal     SpO2:  98% 98% 98%    General: appears to be stated age; non-responsive to verbal stimuli; non-verbal; unable to follow instructions. Skin: warm, dry, no rash Head:  AT/[place] Mouth:  Oral mucosa membranes appear moist, normal dentition Neck: supple; trachea midline Heart:  RRR; did not appreciate any M/R/G Lungs: CTAB, did not appreciate any wheezes, rales, or rhonchi Abdomen: + BS; soft, ND, NT Vascular: 2+ pedal pulses b/l; 2+ radial pulses b/l Extremities: no peripheral edema, no muscle wasting Neuro: In the setting of the patient's altered mental status and associated inability to follow instructions at this time, unable to perform complete neurologic exam at this time, including unable to perform full evaluation of strength, sensation, or cranial nerves.   Labs on Admission: I have personally reviewed following labs and imaging studies  CBC: Recent Labs  Lab 06/07/20 1957  WBC 7.6  NEUTROABS 4.2  HGB 12.7  HCT 38.9  MCV 91.1  PLT 218   Basic Metabolic Panel: Recent Labs  Lab 06/07/20 1957 06/08/20 0025  NA 142  --   K 3.1*  --   CL 105  --   CO2 24  --   GLUCOSE 136*  --   BUN 9  --   CREATININE 0.72  --   CALCIUM 9.6  --   MG  --  1.7  PHOS  --  3.4   GFR: CrCl cannot be calculated (Unknown ideal weight.). Liver Function Tests: Recent Labs  Lab 06/07/20 1957  AST 17  ALT 14  ALKPHOS 73   BILITOT 0.2*  PROT 6.6  ALBUMIN 3.7   No results for input(s): LIPASE, AMYLASE in the last 168 hours. No results for input(s): AMMONIA in the last 168 hours. Coagulation Profile: No results for input(s): INR, PROTIME in the last 168 hours. Cardiac Enzymes: Recent Labs  Lab 06/08/20 0025  CKTOTAL 65   BNP (last 3 results) No results for input(s): PROBNP in the last 8760 hours. HbA1C: No results for input(s): HGBA1C in the last 72 hours. CBG: Recent Labs  Lab  06/07/20 1959  GLUCAP 109*   Lipid Profile: No results for input(s): CHOL, HDL, LDLCALC, TRIG, CHOLHDL, LDLDIRECT in the last 72 hours. Thyroid Function Tests: No results for input(s): TSH, T4TOTAL, FREET4, T3FREE, THYROIDAB in the last 72 hours. Anemia Panel: No results for input(s): VITAMINB12, FOLATE, FERRITIN, TIBC, IRON, RETICCTPCT in the last 72 hours. Urine analysis:    Component Value Date/Time   COLORURINE STRAW (A) 06/07/2020 2113   APPEARANCEUR HAZY (A) 06/07/2020 2113   LABSPEC 1.008 06/07/2020 2113   PHURINE 5.0 06/07/2020 2113   GLUCOSEU NEGATIVE 06/07/2020 2113   GLUCOSEU NEGATIVE 07/20/2007 1634   HGBUR NEGATIVE 06/07/2020 2113   BILIRUBINUR NEGATIVE 06/07/2020 2113   KETONESUR NEGATIVE 06/07/2020 2113   PROTEINUR NEGATIVE 06/07/2020 2113   UROBILINOGEN 0.2 mg/dL 09/32/3557 3220   NITRITE NEGATIVE 06/07/2020 2113   LEUKOCYTESUR NEGATIVE 06/07/2020 2113    Radiological Exams on Admission: CT Head Wo Contrast  Result Date: 06/07/2020 CLINICAL DATA:  Mental status change of unknown cause. EXAM: CT HEAD WITHOUT CONTRAST TECHNIQUE: Contiguous axial images were obtained from the base of the skull through the vertex without intravenous contrast. COMPARISON:  02/21/2013 FINDINGS: Brain: No evidence of acute infarction, hemorrhage, hydrocephalus, extra-axial collection or mass lesion/mass effect. Vascular: No hyperdense vessel or unexpected calcification. Skull: Normal. Negative for fracture or focal  lesion. Sinuses/Orbits: Globes and orbits are unremarkable. Sinuses are clear. Other: None. IMPRESSION: Normal unenhanced CT scan of the brain. Electronically Signed   By: Amie Portland M.D.   On: 06/07/2020 20:39   DG Chest Portable 1 View  Result Date: 06/07/2020 CLINICAL DATA:  Rapid shallow breathing. Unresponsive to breathing instructions. EMS called for seizure like activity. Found in car by EMS having seizure like activity. EXAM: PORTABLE CHEST 1 VIEW COMPARISON:  06/12/2014. FINDINGS: Cardiac silhouette normal in size.  No mediastinal or hilar masses. Lung volumes are low. Lungs are clear. No convincing pleural effusion and no pneumothorax. Skeletal structures are grossly intact. IMPRESSION: No active disease. Electronically Signed   By: Amie Portland M.D.   On: 06/07/2020 20:20     EKG: Independently reviewed, with result as described above.    Assessment/Plan   Kathy [name] is a 54 y.o. female with medical history significant for hypertension, acquired hypothyroidism, chronic pain syndrome, chronic fatigue syndrome, generalized anxiety disorder, depression, degenerative disc disease of the lumbar spine, who is admitted to [facility] on 06/08/2020 with suspected seizure activity after presenting from home via EMS to [facility] Emergency Department for evaluation of shaking activity.   Principal Problem:   Seizure (HCC) Active Problems:   Acquired hypothyroidism   Generalized anxiety disorder   Acute encephalopathy   Hypokalemia   Essential hypertension    #) Acute Seizure: In the context of no known history of prior seizures, the patient presents with suspected seizures, generalized tonic clonic in nature versus focal seizure activity involving the left upper extremity, with no clear underlying etiology.  No history of alcohol abuse or withdrawal, which appears supported by negative serum ethanol level at presentation.  Considered the possibility of benzodiazepine  versus opioid withdrawal, although history provided by patient's boyfriend does not appear to support these possibilities, while urinary drug screen is positive for tetrahydrozoline cannabinol, but otherwise negative, including negative for cocaine/amphetamines.  Of note, she does appear to be on a seizure threshold lowering medication in the form of Zoloft.  No evidence of contributory electrolyte abnormalities, namely no evidence of hyponatremia or hypomagnesemia.  No underlying evidence  of infectious process at this time and no evidence of underlying uremia.  Of note, noncontrast CT of the head showed no evidence of acute intracranial process.  The patient's case was discussed with the on-call neurologist, Dr. Iver Nestle, who will formally consult on this patient and has already evaluated the patient in person. Dr. Rollene Fare ensuing recommendations include EEG in the morning to be ordered by neurology, trend serial CPK level, check MRI brain.  Should the patient develop subsequent seizures, Dr. Iver Nestle recommends Vimpat preferentially over Keppra.  Specifically, in the setting of subsequent seizure, she recommends Vimpat 200 mg IV x1 followed by 100 mg twice daily.  Additionally, while there is no clinical evidence to suggest underlying infectious process at this time, Dr. Iver Nestle recommends, if the patient subsequently comes febrile, pursuit of lumbar puncture as well as empiric meningitis coverage.  No evidence of additional seizure activity following administration of Ativan 2 mg IV x1 around 2000 in the ED.   Plan: NPO.  Seizure precautions.  Monitor on telemetry.  EEG to be ordered by neurology in the morning.  CMP in the morning.  Repeat CBC in the morning.  Repeat serum magnesium level in the a.m.  Close monitoring for development of fever, as above.  Formal neurology consult, as above.  Loading and maintenance dose of IV Vimpat should the patient exhibit additional evidence of seizure activity, as further  quantified above.  Check prolactin level.  MRI brain, as above.  Repeat CPK level in the morning.       #) Hypokalemia: Presenting serum potassium level noted to be 3.1, with magnesium level within normal limits.  Plan: Potassium chloride 40 mill equivalents IV over 4 hours x 1 dose now.  Monitor on telemetry.  Repeat serum potassium level and serum magnesium levels in the morning.     #) Acute encephalopathy: Diminished responsiveness following onset of suspected seizure-like activity earlier today, with acute encephalopathy suspected to be multifactorial in nature, with contribution from postictal state as well as pharmacologic contribution from ensuing dose of IV Ativan, as above.  No clinical evidence to suggest underlying infectious process at this time.  No evidence of additional metabolic source at present.  UDS positive for tetrahydrocannabinol, but otherwise negative.  Of note, presenting noncontrast CT the head showed no evidence of acute intracranial process.  Plan: Work-up and management of suspected presenting acute seizure, as above, including monitoring with EEG.  NPO.  Repeat CMP and CBC in the morning.  Check TSH.  MRI brain, as above.     #) Essential hypertension: Outpatient antihypertensive regimen appears to consist of HCTZ as well as Toprol-XL.  Systolic blood pressures in the ED have been in the range of 120's - 150's mmHg.  In the context of current n.p.o. status, will hold home antihypertensive medications for now.  Of note, presenting labs reflect serum potassium level of 142, which is notable given suspected presenting seizures in the context of home HCTZ, with hyponatremia serving as a potential etiology for seizures.   Plan: Hold home HCTZ and Toprol-XL for now.  Close monitoring of ensuing blood pressures via routine vital signs.  Repeat BMP in the morning.      #) Acquired hypothyroidism, per chart review, it appears that the patient has a documented history  of hypothyroidism, although it does not appear that she is currently on thyroid supplementation at home.  Plan: Check a TSH.     #) Generalized anxiety disorder/depression: It appears that the patient is  currently on scheduled Zoloft at home.  Additionally, while home medication list appears to include scheduled Xanax, the patient's boyfriend reports that she takes this medication only on a as needed basis.  Specifically he reports that the patient typically takes 1-2 doses of Xanax over the course of 1 week.  Plan: In the setting of current n.p.o. status, will hold home Zoloft.  Additionally, to aid in evaluation of potential underlying seizure disorder with pending EEG, will hold home as needed Xanax for now.      DVT prophylaxis: Lovenox 4 mg subcu daily Code Status: Full code Family Communication: none Disposition Plan: Per Rounding Team Consults called: Case was discussed with the on-call neurologist, Dr. Iver Nestle, as further described above. Admission status: Inpatient; PCU.    Of note, this patient was added by me to the following Admit List/Treatment Team: [email or web address]     PLEASE NOTE THAT DRAGON DICTATION SOFTWARE WAS USED IN THE CONSTRUCTION OF THIS NOTE.   Koa Fava DO [facility] Pager [phone] From 7PM- 7AM  06/08/2020, 1:52 AM

## 2020-06-08 NOTE — Progress Notes (Signed)
Subjective: No further episodes.  After discussing with her about the episode, she remembers the entire episode including her significant other calling on-star to get help and then EMS arriving.  He remembers shaking on both sides.  Exam: Vitals:   06/08/20 1030 06/08/20 1141  BP: 130/80 123/69  Pulse: 71 71  Resp: 16 17  Temp:  97.6 F (36.4 C)  SpO2: 100% 99%   Gen: In bed, NAD Resp: non-labored breathing, no acute distress Abd: soft, nt  Neuro: MS: Awake, alert, interactive and appropriate CN: Pupils equal round reactive, extraocular movements intact Motor: Moves all extremities well, no focal weakness Sensory: Intact light touch  Pertinent Labs: Magnesium 1.7 UDS positive only for THC Normal creatinine   Impression: 54 year old female with episode of shaking of unclear etiology.  The fact that she remembers the entire episode including bilateral shaking could argue against epileptic seizure.  If this was an epileptic seizure, would represent her first ever seizure and therefore unless she has a positive EEG I would not favor starting antiepileptic therapy.  I did discuss with her that with concern for seizure, she is not allowed to drive by [facility] state law for a period of 6 months.  She expressed understanding of this.  Recommendations: 1) EEG 2) if EEG is negative, no further work-up at this time 3) I have gone over driving restrictions, advised the patient to take baths and soap showers, avoid working on ladders, etc.  Ritta Slot, MD [facility] [phone]  If 7pm- 7am, please page neurology on call as listed in AMION.

## 2020-06-08 NOTE — Progress Notes (Signed)
Pt brought on unit by ED nurse. Vitals took and pt hooked on heart monitor CCMD notified. Pt is comfortable and oriented to unit bed in the lowest position with alarm on and call bell within reach.

## 2020-06-08 NOTE — Procedures (Signed)
Patient Name: Kathy [name]  MRN: [id number]  Epilepsy Attending: Charlsie Quest  Referring Physician/Provider: Dr Ritta Slot Date: 06/08/2020 Duration: 25.58 mins  Patient history: 54 year old female with episode of shaking of unclear etiology. EEG to evaluate for seizure  Level of alertness: Awake, asleep  AEDs during EEG study: None  Technical aspects: This EEG study was done with scalp electrodes positioned according to the 10-20 International system of electrode placement. Electrical activity was acquired at a sampling rate of 500Hz  and reviewed with a high frequency filter of 70Hz  and a low frequency filter of 1Hz . EEG data were recorded continuously and digitally stored.   Description: The posterior dominant rhythm consists of 15-18 Hz of beta activity. Sleep was characterized by vertex waves, sleep spindles (12 to 14 Hz), maximal frontocentral region. Hyperventilation and photic stimulation were not performed.     IMPRESSION: This study is within normal limits. No seizures or epileptiform discharges were seen throughout the recording.  [name] 

## 2020-06-08 NOTE — Progress Notes (Addendum)
PROGRESS NOTE    Kathy [name]  MBW:[id number] DOB: [birth year] DOA: 06/07/2020 PCP: Renard Hamper, MD   Brief Narrative:  Kathy [name] is a 54 y.o. female with medical history significant for hypertension, acquired hypothyroidism, chronic pain syndrome, chronic fatigue syndrome, generalized anxiety disorder, depression, degenerative disc disease of the lumbar spine, who is admitted to [facility] on 06/08/2020 with suspected seizure activity after presenting from home via EMS to [facility] Emergency Department for evaluation of shaking activity.  Assessment & Plan:   Principal Problem:   Seizure (HCC) Active Problems:   Acquired hypothyroidism   Generalized anxiety disorder   Acute encephalopathy   Hypokalemia   Essential hypertension   Acute metabolic encephalopathy, rule out seizure, questionably provoked by illicit substance use and abuse. -Neurology following, EEG pending to rule out seizure-like activity given presentation -Currently holding off on antiepileptic medications given concern for provoked nature -Patient ANO x4 today, markedly improving with minimal supportive care.  Hypokalemia, resolved -Resolving, follow morning labs  Hypertension, essential -Resume home amlodipine/valsartan  Hypothyroidism, chronic -Resume home thyroid medication  Migraine, acute recurrent  -Currently on Aimovig -Continues to report semidaily migraines without aura  Anxiety/depression -Does not appear to be in any home medications, occasional as needed use of Xanax and CBD infused substances reported at bedside.   DVT prophylaxis: Lovenox  Code Status: Full code Family Communication: none   Status is: Inpatient  Dispo: The patient is from: Home              Anticipated d/c is to: Home              Anticipated d/c date is: 24 to 48 hours              Patient currently not medically stable for discharge  Consultants:   Neuro  Procedures:   EEG  pending  Antimicrobials:  None indicated   Subjective: No acute issues or events overnight, much more awake alert this morning denies nausea, vomiting, diarrhea, constipation, headache, fevers, chills.  Objective: Vitals:   06/08/20 0545 06/08/20 0600 06/08/20 0615 06/08/20 0645  BP: 118/62 120/67 128/73 (!) 151/91  Pulse: 71 73 77 75  Resp: 15 14 14 15   Temp:    (!) 97.5 F (36.4 C)  TempSrc:    Oral  SpO2: 98% 97% 98% 100%    Intake/Output Summary (Last 24 hours) at 06/08/2020 0748 Last data filed at 06/08/2020 0215 Gross per 24 hour  Intake 1500 ml  Output --  Net 1500 ml   There were no vitals filed for this visit.  Examination:  General exam: Appears calm and comfortable  Respiratory system: Clear to auscultation. Respiratory effort normal. Cardiovascular system: S1 & S2 heard, RRR. No JVD, murmurs, rubs, gallops or clicks. No pedal edema. Gastrointestinal system: Abdomen is nondistended, soft and nontender. No organomegaly or masses felt. Normal bowel sounds heard. Central nervous system: Alert and oriented. No focal neurological deficits. Extremities: Symmetric 5 x 5 power. Skin: No rashes, lesions or ulcers Psychiatry: Judgement and insight appear normal. Mood & affect appropriate.     Data Reviewed: I have personally reviewed following labs and imaging studies  CBC: Recent Labs  Lab 06/07/20 1957 06/08/20 0627  WBC 7.6 8.2  NEUTROABS 4.2  --   HGB 12.7 11.8*  HCT 38.9 35.8*  MCV 91.1 91.6  PLT 218 218   Basic Metabolic Panel: Recent Labs  Lab 06/07/20 1957 06/08/20 0025 06/08/20 0627  NA  142  --  142  K 3.1*  --  3.4*  CL 105  --  106  CO2 24  --  27  GLUCOSE 136*  --  94  BUN 9  --  10  CREATININE 0.72  --  0.64  CALCIUM 9.6  --  9.6  MG  --  1.7 1.6*  PHOS  --  3.4 4.1   GFR: CrCl cannot be calculated (Unknown ideal weight.). Liver Function Tests: Recent Labs  Lab 06/07/20 1957 06/08/20 0627  AST 17 16  ALT 14 15  ALKPHOS 73  78  BILITOT 0.2* 0.4  PROT 6.6 6.1*  ALBUMIN 3.7 3.2*   No results for input(s): LIPASE, AMYLASE in the last 168 hours. No results for input(s): AMMONIA in the last 168 hours. Coagulation Profile: No results for input(s): INR, PROTIME in the last 168 hours. Cardiac Enzymes: Recent Labs  Lab 06/08/20 0025 06/08/20 0627  CKTOTAL 65 89   BNP (last 3 results) No results for input(s): PROBNP in the last 8760 hours. HbA1C: No results for input(s): HGBA1C in the last 72 hours. CBG: Recent Labs  Lab 06/07/20 1959  GLUCAP 109*   Lipid Profile: No results for input(s): CHOL, HDL, LDLCALC, TRIG, CHOLHDL, LDLDIRECT in the last 72 hours. Thyroid Function Tests: Recent Labs    06/08/20 0627  TSH 1.397   Anemia Panel: No results for input(s): VITAMINB12, FOLATE, FERRITIN, TIBC, IRON, RETICCTPCT in the last 72 hours. Sepsis Labs: No results for input(s): PROCALCITON, LATICACIDVEN in the last 168 hours.  Recent Results (from the past 240 hour(s))  Resp Panel by RT-PCR (Flu A&B, Covid) Nasopharyngeal Swab     Status: None   Collection Time: 06/07/20  9:13 PM   Specimen: Nasopharyngeal Swab; Nasopharyngeal(NP) swabs in vial transport medium  Result Value Ref Range Status   SARS Coronavirus 2 by RT PCR NEGATIVE NEGATIVE Final    Comment: (NOTE) SARS-CoV-2 target nucleic acids are NOT DETECTED.  The SARS-CoV-2 RNA is generally detectable in upper respiratory specimens during the acute phase of infection. The lowest concentration of SARS-CoV-2 viral copies this assay can detect is 138 copies/mL. A negative result does not preclude SARS-Cov-2 infection and should not be used as the sole basis for treatment or other patient management decisions. A negative result may occur with  improper specimen collection/handling, submission of specimen other than nasopharyngeal swab, presence of viral mutation(s) within the areas targeted by this assay, and inadequate number of viral copies(<138  copies/mL). A negative result must be combined with clinical observations, patient history, and epidemiological information. The expected result is Negative.  Fact Sheet for Patients:  BloggerCourse.comhttps://www.fda.gov/media/152166/download  Fact Sheet for Healthcare Providers:  SeriousBroker.ithttps://www.fda.gov/media/152162/download  This test is no t yet approved or cleared by the [place] FDA and  has been authorized for detection and/or diagnosis of SARS-CoV-2 by FDA under an Emergency Use Authorization (EUA). This EUA will remain  in effect (meaning this test can be used) for the duration of the COVID-19 declaration under Section 564(b)(1) of the Act, 21 U.S.C.section 360bbb-3(b)(1), unless the authorization is terminated  or revoked sooner.       Influenza A by PCR NEGATIVE NEGATIVE Final   Influenza B by PCR NEGATIVE NEGATIVE Final    Comment: (NOTE) The Xpert Xpress SARS-CoV-2/FLU/RSV plus assay is intended as an aid in the diagnosis of influenza from Nasopharyngeal swab specimens and should not be used as a sole basis for treatment. Nasal washings and aspirates are unacceptable for Xpert  Xpress SARS-CoV-2/FLU/RSV testing.  Fact Sheet for Patients: BloggerCourse.com  Fact Sheet for Healthcare Providers: SeriousBroker.it  This test is not yet approved or cleared by the [place] FDA and has been authorized for detection and/or diagnosis of SARS-CoV-2 by FDA under an Emergency Use Authorization (EUA). This EUA will remain in effect (meaning this test can be used) for the duration of the COVID-19 declaration under Section 564(b)(1) of the Act, 21 U.S.C. section 360bbb-3(b)(1), unless the authorization is terminated or revoked.  Performed at [facility], [address], [place], [place] [address]          Radiology Studies: CT Head Wo Contrast  Result Date: 06/07/2020 CLINICAL DATA:  Mental status change of unknown cause.  EXAM: CT HEAD WITHOUT CONTRAST TECHNIQUE: Contiguous axial images were obtained from the base of the skull through the vertex without intravenous contrast. COMPARISON:  02/21/2013 FINDINGS: Brain: No evidence of acute infarction, hemorrhage, hydrocephalus, extra-axial collection or mass lesion/mass effect. Vascular: No hyperdense vessel or unexpected calcification. Skull: Normal. Negative for fracture or focal lesion. Sinuses/Orbits: Globes and orbits are unremarkable. Sinuses are clear. Other: None. IMPRESSION: Normal unenhanced CT scan of the brain. Electronically Signed   By: Amie Portland M.D.   On: 06/07/2020 20:39   MR BRAIN WO CONTRAST  Result Date: 06/08/2020 CLINICAL DATA:  Initial evaluation for acute seizure. EXAM: MRI HEAD WITHOUT CONTRAST TECHNIQUE: Multiplanar, multiecho pulse sequences of the brain and surrounding structures were obtained without intravenous contrast. COMPARISON:  Prior head CT from earlier the same day. FINDINGS: Brain: Examination degraded by motion artifact. Cerebral volume within normal limits for age. Scattered patchy subcentimeter T2/FLAIR hyperintensity seen involving the periventricular deep white matter both cerebral hemispheres, nonspecific, but most commonly related to chronic microvascular ischemic disease, overall mild to moderate in nature. No abnormal foci of restricted diffusion to suggest acute or subacute ischemia or changes related to seizure. Gray-white matter differentiation maintained. No encephalomalacia to suggest chronic cortical infarction. No foci of susceptibility artifact to suggest acute or chronic intracranial hemorrhage. No mass lesion, midline shift or mass effect. No hydrocephalus or extra-axial fluid collection. Pituitary gland suprasellar region within normal limits. No intrinsic temporal lobe abnormality. Vascular: Major intracranial vascular flow voids are maintained. Skull and upper cervical spine: Craniocervical junction normal. Bone marrow  signal intensity within normal limits. No scalp soft tissue abnormality. Sinuses/Orbits: Globes and orbital soft tissues within normal limits. Paranasal sinuses are clear. No mastoid effusion. Inner ear structures within normal limits. Other: None. IMPRESSION: 1. No acute intracranial abnormality. 2. Mild-to-moderate cerebral white matter changes, nonspecific, but most commonly related to chronic microvascular ischemic disease. Electronically Signed   By: Rise Mu M.D.   On: 06/08/2020 00:19   DG Chest Portable 1 View  Result Date: 06/07/2020 CLINICAL DATA:  Rapid shallow breathing. Unresponsive to breathing instructions. EMS called for seizure like activity. Found in car by EMS having seizure like activity. EXAM: PORTABLE CHEST 1 VIEW COMPARISON:  06/12/2014. FINDINGS: Cardiac silhouette normal in size.  No mediastinal or hilar masses. Lung volumes are low. Lungs are clear. No convincing pleural effusion and no pneumothorax. Skeletal structures are grossly intact. IMPRESSION: No active disease. Electronically Signed   By: Amie Portland M.D.   On: 06/07/2020 20:20        Scheduled Meds: . enoxaparin (LOVENOX) injection  40 mg Subcutaneous Q24H   Continuous Infusions:   LOS: 0 days   Time spent:  Azucena Fallen, DO [facility]  If 7PM-7AM,  please contact night-coverage [email or web address]  06/08/2020, 7:48 AM

## 2020-06-09 DIAGNOSIS — I10 Essential (primary) hypertension: Secondary | ICD-10-CM | POA: Diagnosis not present

## 2020-06-09 LAB — PROLACTIN: Prolactin: 7.4 ng/mL (ref 4.8–23.3)

## 2020-06-09 MED ORDER — ACETAMINOPHEN 325 MG PO TABS
650.0000 mg | ORAL_TABLET | Freq: Once | ORAL | Status: AC
  Administered 2020-06-09: 650 mg via ORAL
  Filled 2020-06-09: qty 2

## 2020-06-09 NOTE — Progress Notes (Signed)
Pt's IVs removed; sites are clean, dry and intact. Discharge instructions were reviewed with pt; voiced understanding. Personal belongings were packed by pt. Pt. transported via wheelchair to private vehicle driven by pt's friend and brother.

## 2020-06-09 NOTE — TOC Transition Note (Signed)
Transition of Care [facility]) - CM/SW Discharge Note   Patient Details  Name: Kathy [name] MRN: [id number] Date of Birth: 12/25/1965  [facility]) CM/SW Contact:  Kermit Balo, RN Phone Number: 06/09/2020, 11:58 AM   Clinical Narrative:    Pt is discharging home with self care. No needs per TOC.  Final next level of care: Home/Self Care Barriers to Discharge: No Barriers Identified   Patient Goals and CMS Choice        Discharge Placement                       Discharge Plan and Services                                     Social Determinants of Health (SDOH) Interventions     Readmission Risk Interventions No flowsheet data found.

## 2020-06-09 NOTE — Discharge Summary (Signed)
Physician Discharge Summary  Kathy [name]:[id number] DOB: 1965/12/08 DOA: 06/07/2020  PCP: Azzie Almas, MD  Admit date: 06/07/2020 Discharge date: 06/09/2020  Admitted From: Home Disposition: Home  Recommendations for Outpatient Follow-up:  1. Follow up with PCP in 1-2 weeks 2. Please obtain BMP/CBC in one week  Home Health: None Equipment/Devices: None  Discharge Condition: Stable CODE STATUS: Full Diet recommendation: As tolerated  Brief/Interim Summary: Kathy M Alstonis a 54 y.o.femalewith medical history significant forhypertension, acquired hypothyroidism, chronic pain syndrome, chronic fatigue syndrome, generalized anxiety disorder, depression, degenerative disc disease of the lumbar spine,who is admitted to [name] on 12/6/2021with suspected seizure activityafter presenting from home via [name] for evaluation of shaking activity.  Patient admitted as above with acute mental status changes with questionable syncopal event.  She has somewhat circuitous history, unclear if patient had taken illicit substances prior to event, patient also reports eating poorly prior to event.  Patient was presented to the ED from outpatient setting in a restaurant with reported shaking concerning for seizure.  Fortunately patient has no known history of seizure few risk factors, EEG reassuringly negative.  Neurology otherwise indicates further outpatient work-up appropriate but otherwise patient stable and agreeable for discharge home.  No medication changes at this time.  We did discuss the need to stop illicit substances or unknown substances given her possible reaction to CBD gummy bear with THC positive UDS.  Discharge Diagnoses:  Principal Problem:   Seizure [facility]) Active Problems:   Acquired hypothyroidism   Generalized anxiety disorder   Acute encephalopathy   Hypokalemia   Essential hypertension    Discharge  Instructions  Discharge Instructions    Diet - low sodium heart healthy   Complete by: As directed    Increase activity slowly   Complete by: As directed      Allergies as of 06/09/2020      Reactions   Amoxicillin Shortness Of Breath, Swelling   edema   Codeine Hives   Tylenol w/codeine   Hydrocodone-acetaminophen Hives   Ms Contin [morphine] Other (See Comments)   Tablets don't digest correctly/tolerates morphine drip   Amitriptyline Itching, Rash, Other (See Comments)   Hallucinations/insomnia   Latex Rash   Tramadol Rash      Medication List    TAKE these medications   acetaminophen 500 MG tablet Commonly known as: TYLENOL Take 500 mg by mouth daily as needed for headache (pain).   Aimovig 140 MG/ML Soaj Generic drug: Erenumab-aooe Inject 140 mg into the skin every 30 (thirty) days.   amLODIPine-Valsartan-HCTZ 5-160-12.5 MG Tabs Take 1 tablet by mouth daily.   cyanocobalamin 1000 MCG/ML injection Commonly known as: (VITAMIN B-12) Inject 1,000 mcg into the muscle every Thursday.   EpiPen 2-Pak 0.3 mg/0.3 mL Soaj injection Generic drug: EPINEPHrine Inject 0.3 mg into the muscle once as needed for anaphylaxis.   gabapentin 300 MG capsule Commonly known as: NEURONTIN Take 300-900 mg by mouth at bedtime as needed (back and leg pain).   GOODY HEADACHE PO Take 1 packet by mouth daily as needed (headache/pain).   Jardiance 10 MG Tabs tablet Generic drug: empagliflozin Take 10 mg by mouth daily.   metFORMIN 500 MG tablet Commonly known as: GLUCOPHAGE Take 500 mg by mouth 2 (two) times daily.   Narcan 4 MG/0.1ML Liqd nasal spray kit Generic drug: naloxone Place 1 spray into the nose once as needed (opioid overdose).   oxyCODONE 15 MG immediate release tablet Commonly known  as: ROXICODONE Take 15 mg by mouth 4 (four) times daily as needed for pain.   Ozempic (1 MG/DOSE) 2 MG/1.5ML Sopn Generic drug: Semaglutide (1 MG/DOSE) Inject 1 mg into the skin every  Thursday.   triamcinolone 0.1 % Commonly known as: KENALOG Apply 1 application topically daily. For dry spots on skin   Valtrex 1000 MG tablet Generic drug: valACYclovir Take 1,000 mg by mouth daily.   Vitamin D-3 125 MCG (5000 UT) Tabs Take 5,000 Units by mouth daily.       Allergies  Allergen Reactions  . Amoxicillin Shortness Of Breath and Swelling    edema  . Codeine Hives    Tylenol w/codeine  . Hydrocodone-Acetaminophen Hives  . Ms Contin [Morphine] Other (See Comments)    Tablets don't digest correctly/tolerates morphine drip  . Amitriptyline Itching, Rash and Other (See Comments)    Hallucinations/insomnia  . Latex Rash  . Tramadol Rash    Consultations: Neurology  Procedures/Studies: EEG  Result Date: 06/08/2020 Kathy Havens, MD     06/08/2020  5:17 PM Patient Name: Kathy [name] MRN: [id number] Epilepsy Attending: Lora [name] Referring Physician/Provider: Dr Roland [name] Date: 06/08/2020 Duration: 25.58 mins Patient history: 54 year old female with episode of shaking of unclear etiology. EEG to evaluate for seizure Level of alertness: Awake, asleep AEDs during EEG study: None Technical aspects: This EEG study was done with scalp electrodes positioned according to the 10-20 International system of electrode placement. Electrical activity was acquired at a sampling rate of 500Hz  and reviewed with a high frequency filter of 70Hz  and a low frequency filter of 1Hz . EEG data were recorded continuously and digitally stored. Description: The posterior dominant rhythm consists of 15-18 Hz of beta activity. Sleep was characterized by vertex waves, sleep spindles (12 to 14 Hz), maximal frontocentral region. Hyperventilation and photic stimulation were not performed.   IMPRESSION: This study is within normal limits. No seizures or epileptiform discharges were seen throughout the recording. [name]   CT Head Wo Contrast  Result Date: 06/07/2020 CLINICAL  DATA:  Mental status change of unknown cause. EXAM: CT HEAD WITHOUT CONTRAST TECHNIQUE: Contiguous axial images were obtained from the base of the skull through the vertex without intravenous contrast. COMPARISON:  02/21/2013 FINDINGS: Brain: No evidence of acute infarction, hemorrhage, hydrocephalus, extra-axial collection or mass lesion/mass effect. Vascular: No hyperdense vessel or unexpected calcification. Skull: Normal. Negative for fracture or focal lesion. Sinuses/Orbits: Globes and orbits are unremarkable. Sinuses are clear. Other: None. IMPRESSION: Normal unenhanced CT scan of the brain. Electronically Signed   By: Lajean Manes M.D.   On: 06/07/2020 20:39   MR BRAIN WO CONTRAST  Result Date: 06/08/2020 CLINICAL DATA:  Initial evaluation for acute seizure. EXAM: MRI HEAD WITHOUT CONTRAST TECHNIQUE: Multiplanar, multiecho pulse sequences of the brain and surrounding structures were obtained without intravenous contrast. COMPARISON:  Prior head CT from earlier the same day. FINDINGS: Brain: Examination degraded by motion artifact. Cerebral volume within normal limits for age. Scattered patchy subcentimeter T2/FLAIR hyperintensity seen involving the periventricular deep white matter both cerebral hemispheres, nonspecific, but most commonly related to chronic microvascular ischemic disease, overall mild to moderate in nature. No abnormal foci of restricted diffusion to suggest acute or subacute ischemia or changes related to seizure. Gray-white matter differentiation maintained. No encephalomalacia to suggest chronic cortical infarction. No foci of susceptibility artifact to suggest acute or chronic intracranial hemorrhage. No mass lesion, midline shift or mass effect. No hydrocephalus or extra-axial fluid collection. Pituitary gland  suprasellar region within normal limits. No intrinsic temporal lobe abnormality. Vascular: Major intracranial vascular flow voids are maintained. Skull and upper cervical  spine: Craniocervical junction normal. Bone marrow signal intensity within normal limits. No scalp soft tissue abnormality. Sinuses/Orbits: Globes and orbital soft tissues within normal limits. Paranasal sinuses are clear. No mastoid effusion. Inner ear structures within normal limits. Other: None. IMPRESSION: 1. No acute intracranial abnormality. 2. Mild-to-moderate cerebral white matter changes, nonspecific, but most commonly related to chronic microvascular ischemic disease. Electronically Signed   By: Jeannine Boga M.D.   On: 06/08/2020 00:19   MR Lumbar Spine W Wo Contrast  Result Date: 05/19/2020 CLINICAL DATA:  Facet degenerative change.  Chronic lumbar pain. EXAM: MRI LUMBAR SPINE WITHOUT AND WITH CONTRAST TECHNIQUE: Multiplanar and multiecho pulse sequences of the lumbar spine were obtained without and with intravenous contrast. CONTRAST:  15 mL of MultiHance IV. COMPARISON:  MRI lumbar spine 02/10/2019 FINDINGS: Segmentation: Standard. The inferior-most fully formed intervertebral disc is labeled L5-S1. Alignment: Trace anterolisthesis of L4 on L5. Otherwise, physiologic. Vertebrae: Vertebral body heights are maintained. Scattered vertebral venous malformations. Otherwise, no focal marrow signal abnormality to suggest acute fracture, discitis/osteomyelitis, or suspicious bone lesion. Conus medullaris and cauda equina: Conus extends to the superior L2 level. Conus appears normal. Paraspinal and other soft tissues: Unremarkable. Disc levels: T11-T12: Only imaged on sagittal sequences without evidence of significant canal or foraminal stenosis. T12-L1: No significant disc protrusion, foraminal stenosis, or canal stenosis. L1-L2: No significant disc protrusion, foraminal stenosis, or canal stenosis. L2-L3: No significant disc protrusion, foraminal stenosis, or canal stenosis. L3-L4: Small broad-based disc bulge and bilateral facet hypertrophy with ligamentum flavum thickening. Resulting mild  bilateral foraminal stenosis and mild bilateral subarticular recess stenosis. No significant central canal stenosis. L4-L5: Trace anterolisthesis of L4 on L5. Broad-based disc bulge with moderate bilateral facet hypertrophy and ligamentum flavum thickening. Resulting moderate central canal and bilateral subarticular recess stenosis, slightly progressed from prior. Similar mild left greater than right foraminal stenosis. L5-S1: Similar small broad-based disc bulge with small central disc protrusion. Disc contacts bilateral descending S1 nerve roots, similar to prior. Similar mild bilateral facet hypertrophy. No significant canal or foraminal stenosis. IMPRESSION: 1. Slight progression of moderate central canal and bilateral subarticular recess stenosis at L4-L5. Similar mild left greater than right foraminal stenosis at this level. 2. Similar small central disc protrusion at L5-S1 which contacts bilateral descending L5 nerve roots without evidence of impingement. 3. Similar mild bilateral subarticular recess and foraminal stenosis at L3-L4. Electronically Signed   By: Margaretha Sheffield MD   On: 05/19/2020 09:08   DG Chest Portable 1 View  Result Date: 06/07/2020 CLINICAL DATA:  Rapid shallow breathing. Unresponsive to breathing instructions. EMS called for seizure like activity. Found in car by EMS having seizure like activity. EXAM: PORTABLE CHEST 1 VIEW COMPARISON:  06/12/2014. FINDINGS: Cardiac silhouette normal in size.  No mediastinal or hilar masses. Lung volumes are low. Lungs are clear. No convincing pleural effusion and no pneumothorax. Skeletal structures are grossly intact. IMPRESSION: No active disease. Electronically Signed   By: Lajean Manes M.D.   On: 06/07/2020 20:20     Subjective: No acute issues or events overnight   Discharge Exam: Vitals:   06/09/20 0734 06/09/20 1130  BP: 116/78 119/77  Pulse: 63 64  Resp: 16 16  Temp: 98 F (36.7 C) 97.7 F (36.5 C)  SpO2: 98% 99%    Vitals:   06/09/20 0344 06/09/20 0500 06/09/20 0734 06/09/20 1130  BP: 115/67  116/78 119/77  Pulse: 73  63 64  Resp: 15  16 16   Temp: 98.1 F (36.7 C)  98 F (36.7 C) 97.7 F (36.5 C)  TempSrc: Oral  Oral Oral  SpO2: 96%  98% 99%  Weight:  86.3 kg      General: Pt is alert, awake, not in acute distress Cardiovascular: RRR, S1/S2 +, no rubs, no gallops Respiratory: CTA bilaterally, no wheezing, no rhonchi Abdominal: Soft, NT, ND, bowel sounds + Extremities: no edema, no cyanosis    The results of significant diagnostics from this hospitalization (including imaging, microbiology, ancillary and laboratory) are listed below for reference.     Microbiology: Recent Results (from the past 240 hour(s))  Resp Panel by RT-PCR (Flu A&B, Covid) Nasopharyngeal Swab     Status: None   Collection Time: 06/07/20  9:13 PM   Specimen: Nasopharyngeal Swab; Nasopharyngeal(NP) swabs in vial transport medium  Result Value Ref Range Status   SARS Coronavirus 2 by RT PCR NEGATIVE NEGATIVE Final    Comment: (NOTE) SARS-CoV-2 target nucleic acids are NOT DETECTED.  The SARS-CoV-2 RNA is generally detectable in upper respiratory specimens during the acute phase of infection. The lowest concentration of SARS-CoV-2 viral copies this assay can detect is 138 copies/mL. A negative result does not preclude SARS-Cov-2 infection and should not be used as the sole basis for treatment or other patient management decisions. A negative result may occur with  improper specimen collection/handling, submission of specimen other than nasopharyngeal swab, presence of viral mutation(s) within the areas targeted by this assay, and inadequate number of viral copies(<138 copies/mL). A negative result must be combined with clinical observations, patient history, and epidemiological information. The expected result is Negative.  Fact Sheet for Patients:  EntrepreneurPulse.com.au  Fact Sheet  for Healthcare Providers:  IncredibleEmployment.be  This test is no t yet approved or cleared by the [place] FDA and  has been authorized for detection and/or diagnosis of SARS-CoV-2 by FDA under an Emergency Use Authorization (EUA). This EUA will remain  in effect (meaning this test can be used) for the duration of the COVID-19 declaration under Section 564(b)(1) of the Act, 21 U.S.C.section 360bbb-3(b)(1), unless the authorization is terminated  or revoked sooner.       Influenza A by PCR NEGATIVE NEGATIVE Final   Influenza B by PCR NEGATIVE NEGATIVE Final    Comment: (NOTE) The Xpert Xpress SARS-CoV-2/FLU/RSV plus assay is intended as an aid in the diagnosis of influenza from Nasopharyngeal swab specimens and should not be used as a sole basis for treatment. Nasal washings and aspirates are unacceptable for Xpert Xpress SARS-CoV-2/FLU/RSV testing.  Fact Sheet for Patients: EntrepreneurPulse.com.au  Fact Sheet for Healthcare Providers: IncredibleEmployment.be  This test is not yet approved or cleared by the [place] FDA and has been authorized for detection and/or diagnosis of SARS-CoV-2 by FDA under an Emergency Use Authorization (EUA). This EUA will remain in effect (meaning this test can be used) for the duration of the COVID-19 declaration under Section 564(b)(1) of the Act, 21 U.S.C. section 360bbb-3(b)(1), unless the authorization is terminated or revoked.  Performed at [facility], [place] [address] [place], [place] [address]      Labs: BNP (last 3 results) No results for input(s): BNP in the last 8760 hours. Basic Metabolic Panel: Recent Labs  Lab 06/07/20 1957 06/08/20 0025 06/08/20 0627  NA 142  --  142  K 3.1*  --  3.4*  CL 105  --  106  CO2 24  --  27  GLUCOSE 136*  --  94  BUN 9  --  10  CREATININE 0.72  --  0.64  CALCIUM 9.6  --  9.6  MG  --  1.7 1.6*  PHOS  --  3.4 4.1    Liver Function Tests: Recent Labs  Lab 06/07/20 1957 06/08/20 0627  AST 17 16  ALT 14 15  ALKPHOS 73 78  BILITOT 0.2* 0.4  PROT 6.6 6.1*  ALBUMIN 3.7 3.2*   No results for input(s): LIPASE, AMYLASE in the last 168 hours. No results for input(s): AMMONIA in the last 168 hours. CBC: Recent Labs  Lab 06/07/20 1957 06/08/20 0627  WBC 7.6 8.2  NEUTROABS 4.2  --   HGB 12.7 11.8*  HCT 38.9 35.8*  MCV 91.1 91.6  PLT 218 218   Cardiac Enzymes: Recent Labs  Lab 06/08/20 0025 06/08/20 0627  CKTOTAL 65 89   BNP: Invalid input(s): POCBNP CBG: Recent Labs  Lab 06/07/20 1959  GLUCAP 109*   D-Dimer No results for input(s): DDIMER in the last 72 hours. Hgb A1c No results for input(s): HGBA1C in the last 72 hours. Lipid Profile No results for input(s): CHOL, HDL, LDLCALC, TRIG, CHOLHDL, LDLDIRECT in the last 72 hours. Thyroid function studies Recent Labs    06/08/20 0627  TSH 1.397   Anemia work up No results for input(s): VITAMINB12, FOLATE, FERRITIN, TIBC, IRON, RETICCTPCT in the last 72 hours. Urinalysis    Component Value Date/Time   COLORURINE STRAW (A) 06/07/2020 2113   APPEARANCEUR HAZY (A) 06/07/2020 2113   LABSPEC 1.008 06/07/2020 2113   PHURINE 5.0 06/07/2020 2113   GLUCOSEU NEGATIVE 06/07/2020 2113   GLUCOSEU NEGATIVE 07/20/2007 1634   HGBUR NEGATIVE 06/07/2020 2113   BILIRUBINUR NEGATIVE 06/07/2020 2113   KETONESUR NEGATIVE 06/07/2020 2113   PROTEINUR NEGATIVE 06/07/2020 2113   UROBILINOGEN 0.2 mg/dL 07/20/2007 1634   NITRITE NEGATIVE 06/07/2020 2113   LEUKOCYTESUR NEGATIVE 06/07/2020 2113   Sepsis Labs Invalid input(s): PROCALCITONIN,  WBC,  LACTICIDVEN Microbiology Recent Results (from the past 240 hour(s))  Resp Panel by RT-PCR (Flu A&B, Covid) Nasopharyngeal Swab     Status: None   Collection Time: 06/07/20  9:13 PM   Specimen: Nasopharyngeal Swab; Nasopharyngeal(NP) swabs in vial transport medium  Result Value Ref Range Status   SARS  Coronavirus 2 by RT PCR NEGATIVE NEGATIVE Final    Comment: (NOTE) SARS-CoV-2 target nucleic acids are NOT DETECTED.  The SARS-CoV-2 RNA is generally detectable in upper respiratory specimens during the acute phase of infection. The lowest concentration of SARS-CoV-2 viral copies this assay can detect is 138 copies/mL. A negative result does not preclude SARS-Cov-2 infection and should not be used as the sole basis for treatment or other patient management decisions. A negative result may occur with  improper specimen collection/handling, submission of specimen other than nasopharyngeal swab, presence of viral mutation(s) within the areas targeted by this assay, and inadequate number of viral copies(<138 copies/mL). A negative result must be combined with clinical observations, patient history, and epidemiological information. The expected result is Negative.  Fact Sheet for Patients:  EntrepreneurPulse.com.au  Fact Sheet for Healthcare Providers:  IncredibleEmployment.be  This test is no t yet approved or cleared by the [place] FDA and  has been authorized for detection and/or diagnosis of SARS-CoV-2 by FDA under an Emergency Use Authorization (EUA). This EUA will remain  in effect (meaning this test can be used) for the duration of  the COVID-19 declaration under Section 564(b)(1) of the Act, 21 U.S.C.section 360bbb-3(b)(1), unless the authorization is terminated  or revoked sooner.       Influenza A by PCR NEGATIVE NEGATIVE Final   Influenza B by PCR NEGATIVE NEGATIVE Final    Comment: (NOTE) The Xpert Xpress SARS-CoV-2/FLU/RSV plus assay is intended as an aid in the diagnosis of influenza from Nasopharyngeal swab specimens and should not be used as a sole basis for treatment. Nasal washings and aspirates are unacceptable for Xpert Xpress SARS-CoV-2/FLU/RSV testing.  Fact Sheet for  Patients: EntrepreneurPulse.com.au  Fact Sheet for Healthcare Providers: IncredibleEmployment.be  This test is not yet approved or cleared by the [place] FDA and has been authorized for detection and/or diagnosis of SARS-CoV-2 by FDA under an Emergency Use Authorization (EUA). This EUA will remain in effect (meaning this test can be used) for the duration of the COVID-19 declaration under Section 564(b)(1) of the Act, 21 U.S.C. section 360bbb-3(b)(1), unless the authorization is terminated or revoked.  Performed at [facility], [place] [address], [place], [place] [address]      Time coordinating discharge: Over 30 minutes  SIGNED:   Little Ishikawa, DO [facility] 06/09/2020, 5:16 PM Pager   If 7PM-7AM, please contact night-coverage [email or web address]

## 2020-09-08 ENCOUNTER — Ambulatory Visit: Payer: Medicare Other | Admitting: Neurology

## 2020-11-05 ENCOUNTER — Ambulatory Visit: Payer: Medicare Other | Admitting: Neurology

## 2021-01-21 ENCOUNTER — Encounter: Payer: Self-pay | Admitting: *Deleted

## 2021-01-21 ENCOUNTER — Other Ambulatory Visit: Payer: Self-pay | Admitting: *Deleted

## 2021-01-26 ENCOUNTER — Encounter: Payer: Self-pay | Admitting: Diagnostic Neuroimaging

## 2021-01-26 ENCOUNTER — Ambulatory Visit (INDEPENDENT_AMBULATORY_CARE_PROVIDER_SITE_OTHER): Payer: Medicare Other | Admitting: Diagnostic Neuroimaging

## 2021-01-26 VITALS — BP 129/82 | HR 75 | Ht 60.0 in | Wt 175.0 lb

## 2021-01-26 DIAGNOSIS — R4689 Other symptoms and signs involving appearance and behavior: Secondary | ICD-10-CM | POA: Diagnosis not present

## 2021-01-26 DIAGNOSIS — G43109 Migraine with aura, not intractable, without status migrainosus: Secondary | ICD-10-CM

## 2021-01-26 NOTE — Patient Instructions (Signed)
  ABNORMAL SPELL (? seizure vs medication reaction; 06/07/20) - monitor symptoms  MIGRAINE WITH AURA - continue aimovig injections; per [facility]  CHRONIC PAIN  - continue gabapentin, oxycodone prn; per [facility] pain clinic

## 2021-01-26 NOTE — Progress Notes (Signed)
[facility]  PATIENT: Kathy [name] DOB: 02/03/1966  REFERRING CLINICIAN: [name]* HISTORY FROM: patient  REASON FOR VISIT: new consult    HISTORICAL  CHIEF COMPLAINT:  Chief Complaint  Patient presents with   Headache    RM 6 with brother berry  Pt is well, had a seizure like episode in November but having headaches and dizziness about 3-4 times a week since.  She wakes up at time with these headaches.     HISTORY OF PRESENT ILLNESS:   55 year old female here for evaluation of headaches, dizziness, seizures.  06/07/2020 patient was going to a restaurant with friends when she ate a "gummy candy" but patient is not sure if this had THC or not.  Upon going into the restaurant patient had a strawberry daiquiri and dinner.  About 1 hour into dinner if she felt confused, shaky and jittery.  She was awake and aware during this time but had difficulty talking.  Patient went to the hospital for evaluation.  MRI and EEG were unremarkable.  UDS was positive for THC.  No further events since that time.  Patient also having some intermittent headaches, neck pain, pain rating to the right greater than left arm.  Patient also having intermittent headaches 2-3 times a week.   REVIEW OF SYSTEMS: Full 14 system review of systems performed and negative with exception of: as per HPI.  ALLERGIES: Allergies  Allergen Reactions   Amoxicillin Shortness Of Breath and Swelling    edema   Codeine Hives    Tylenol w/codeine   Hydrocodone-Acetaminophen Hives   Ms Contin [Morphine] Other (See Comments)    Tablets don't digest correctly/tolerates morphine drip   Amitriptyline Itching, Rash and Other (See Comments)    Hallucinations/insomnia   Latex Rash   Tramadol Rash    HOME MEDICATIONS: Outpatient Medications Prior to Visit  Medication Sig Dispense Refill   acetaminophen (TYLENOL) 500 MG tablet Take 500 mg by mouth daily as needed for headache (pain).      amLODIPine-Valsartan-HCTZ 5-160-12.5 MG TABS Take 1 tablet by mouth daily.     Cholecalciferol (VITAMIN D-3) 125 MCG (5000 UT) TABS Take 5,000 Units by mouth daily.     cyanocobalamin (,VITAMIN B-12,) 1000 MCG/ML injection Inject 1,000 mcg into the muscle every Thursday.     empagliflozin (JARDIANCE) 10 MG TABS tablet Take 10 mg by mouth daily.     EPINEPHrine 0.3 mg/0.3 mL IJ SOAJ injection Inject 0.3 mg into the muscle once as needed for anaphylaxis.     Erenumab-aooe (AIMOVIG) 140 MG/ML SOAJ Inject 140 mg into the skin every 30 (thirty) days.     gabapentin (NEURONTIN) 300 MG capsule Take 300-900 mg by mouth at bedtime as needed (back and leg pain).      metFORMIN (GLUCOPHAGE) 500 MG tablet Take 500 mg by mouth 2 (two) times daily.     naloxone (NARCAN) 4 MG/0.1ML LIQD nasal spray kit Place 1 spray into the nose once as needed (opioid overdose).     oxyCODONE (ROXICODONE) 15 MG immediate release tablet Take 15 mg by mouth 4 (four) times daily as needed for pain.     Semaglutide, 1 MG/DOSE, (OZEMPIC, 1 MG/DOSE,) 2 MG/1.5ML SOPN Inject 1 mg into the skin every Thursday.     triamcinolone (KENALOG) 0.1 % Apply 1 application topically daily. For dry spots on skin     valACYclovir (VALTREX) 1000 MG tablet Take 1,000 mg by mouth daily.     XIFAXAN 550  MG TABS tablet Take 550 mg by mouth 3 (three) times daily.     Aspirin-Acetaminophen-Caffeine (GOODY HEADACHE PO) Take 1 packet by mouth daily as needed (headache/pain).     No facility-administered medications prior to visit.    PAST MEDICAL HISTORY: Past Medical History:  Diagnosis Date   Anxiety    Degeneration of lumbar or lumbosacral intervertebral disc    Diabetes (HCC)    Disorders of sacrum    Dizziness    Enthesopathy of hip region    Facet syndrome, lumbar    GERD (gastroesophageal reflux disease)    Headache    Hypertension    Myalgia and myositis, unspecified    Seizure-like activity (HCC)    Unequal leg length (acquired)      PAST SURGICAL HISTORY: Past Surgical History:  Procedure Laterality Date   ABDOMINAL HYSTERECTOMY     partial   APPENDECTOMY     CARPAL TUNNEL RELEASE     CHOLECYSTECTOMY     FOOT SURGERY Right    HERNIA REPAIR     KIDNEY STONE SURGERY Left 09/26/2012    FAMILY HISTORY: Family History  Problem Relation Age of Onset   Heart disease Mother    Diabetes Mother    Hypertension Mother    Headache Mother    Stroke Brother    Heart disease Brother    Diabetes Brother    Hypertension Brother    Stroke Brother    Heart disease Brother    Diabetes Brother    Hypertension Brother     SOCIAL HISTORY: Social History   Socioeconomic History   Marital status: Single    Spouse name: Not on file   Number of children: Not on file   Years of education: Not on file   Highest education level: Not on file  Occupational History   Occupation: CNA  Tobacco Use   Smoking status: Never   Smokeless tobacco: Never  Substance and Sexual Activity   Alcohol use: No   Drug use: No   Sexual activity: Not on file  Other Topics Concern   Not on file  Social History Narrative   Not on file   Social Determinants of Health   Financial Resource Strain: Not on file  Food Insecurity: Not on file  Transportation Needs: Not on file  Physical Activity: Not on file  Stress: Not on file  Social Connections: Not on file  Intimate Partner Violence: Not on file     PHYSICAL EXAM  GENERAL EXAM/CONSTITUTIONAL: Vitals:  Vitals:   01/26/21 1242  BP: 129/82  Pulse: 75  Weight: 175 lb (79.4 kg)  Height: 5' (1.524 m)   Body mass index is 34.18 kg/m. Wt Readings from Last 3 Encounters:  01/26/21 175 lb (79.4 kg)  06/09/20 190 lb 4.1 oz (86.3 kg)  10/17/12 172 lb (78 kg)   Patient is in no distress; well developed, nourished and groomed; neck is supple  CARDIOVASCULAR: Examination of carotid arteries is normal; no carotid bruits Regular rate and rhythm, no murmurs Examination of  peripheral vascular system by observation and palpation is normal  EYES: Ophthalmoscopic exam of optic discs and posterior segments is normal; no papilledema or hemorrhages No results found.  MUSCULOSKELETAL: Gait, strength, tone, movements noted in Neurologic exam below  NEUROLOGIC: MENTAL STATUS:  No flowsheet data found. awake, alert, oriented to person, place and time recent and remote memory intact normal attention and concentration language fluent, comprehension intact, naming intact fund of knowledge appropriate  CRANIAL  NERVE:  2nd - no papilledema on fundoscopic exam 2nd, 3rd, 4th, 6th - pupils equal and reactive to light, visual fields full to confrontation, extraocular muscles intact, no nystagmus 5th - facial sensation symmetric 7th - facial strength symmetric 8th - hearing intact 9th - palate elevates symmetrically, uvula midline 11th - shoulder shrug symmetric 12th - tongue protrusion midline  MOTOR:  normal bulk and tone, full strength in the BUE, BLE  SENSORY:  normal and symmetric to light touch, temperature, vibration  COORDINATION:  finger-nose-finger, fine finger movements normal  REFLEXES:  deep tendon reflexes TRACE and symmetric  GAIT/STATION:  narrow based gait    DIAGNOSTIC DATA (LABS, IMAGING, TESTING) - I reviewed patient records, labs, notes, testing and imaging myself where available.  Lab Results  Component Value Date   WBC 8.2 06/08/2020   HGB 11.8 (L) 06/08/2020   HCT 35.8 (L) 06/08/2020   MCV 91.6 06/08/2020   PLT 218 06/08/2020      Component Value Date/Time   NA 142 06/08/2020 0627   K 3.4 (L) 06/08/2020 0627   CL 106 06/08/2020 0627   CO2 27 06/08/2020 0627   GLUCOSE 94 06/08/2020 0627   BUN 10 06/08/2020 0627   CREATININE 0.64 06/08/2020 0627   CALCIUM 9.6 06/08/2020 0627   PROT 6.1 (L) 06/08/2020 0627   ALBUMIN 3.2 (L) 06/08/2020 0627   AST 16 06/08/2020 0627   ALT 15 06/08/2020 0627   ALKPHOS 78 06/08/2020  0627   BILITOT 0.4 06/08/2020 0627   GFRNONAA >60 06/08/2020 0627   GFRAA 118 05/22/2007 0000   Lab Results  Component Value Date   CHOL 142 05/22/2007   HDL 47.6 05/22/2007   LDLCALC 77 05/22/2007   TRIG 89 05/22/2007   CHOLHDL 3.0 CALC 05/22/2007   No results found for: HGBA1C Lab Results  Component Value Date   YOVZCHYI50 277 07/20/2007   Lab Results  Component Value Date   TSH 1.397 06/08/2020    06/07/20 MRI brain 1. No acute intracranial abnormality. 2. Mild-to-moderate cerebral white matter changes, nonspecific, but most commonly related to chronic  microvascular ischemic disease.  06/07/20 CT head  - Normal unenhanced CT scan of the brain.  06/08/20 EEG - normal   ASSESSMENT AND PLAN  55 y.o. year old female here with:  Dx:  1. Spell of abnormal behavior   2. Migraine with aura and without status migrainosus, not intractable      PLAN:  ABNORMAL SPELL (? seizure vs medication reaction) - monitor symptoms  - According to [place] law, you can not drive unless you are seizure / syncope free for at least 6 months and under physician's care.   - Please maintain precautions. Do not participate in activities where a loss of awareness could harm you or someone else. No swimming alone, no tub bathing, no hot tubs, no driving, no operating motorized vehicles (cars, ATVs, motocycles, etc), lawnmowers, power tools or firearms. No standing at heights, such as rooftops, ladders or stairs. Avoid hot objects such as stoves, heaters, open fires. Wear a helmet when riding a bicycle, scooter, skateboard, etc. and avoid areas of traffic. Set your water heater to 120 degrees or less.   MIGRAINE WITH AURA - continue aimovig injections; per [facility]  CHRONIC PAIN (neck, low back) - continue gabapentin, oxycodone prn; per [facility] pain clinic - consider PT evaluation  Return for pending if symptoms worsen or fail to improve, return to PCP.    Penni Bombard, MD  2/99/3716, 9:67 PM Certified in Neurology, Neurophysiology and [facility] [address], [place], [place] [address] [phone]

## 2021-04-23 ENCOUNTER — Other Ambulatory Visit: Payer: Self-pay | Admitting: Student

## 2021-04-23 DIAGNOSIS — M47816 Spondylosis without myelopathy or radiculopathy, lumbar region: Secondary | ICD-10-CM

## 2021-05-03 ENCOUNTER — Other Ambulatory Visit: Payer: Medicare Other

## 2021-05-03 ENCOUNTER — Ambulatory Visit
Admission: RE | Admit: 2021-05-03 | Discharge: 2021-05-03 | Disposition: A | Discharge status: Home / Self Care | Payer: Medicare Other | Source: Ambulatory Visit | Attending: Student | Admitting: Student

## 2021-05-03 ENCOUNTER — Other Ambulatory Visit: Payer: Self-pay

## 2021-05-03 ENCOUNTER — Inpatient Hospital Stay
Admission: RE | Admit: 2021-05-03 | Discharge: 2021-05-03 | Disposition: A | Discharge status: Home / Self Care | Payer: Medicare Other | Source: Ambulatory Visit | Attending: Student | Admitting: Student

## 2021-05-03 DIAGNOSIS — M47816 Spondylosis without myelopathy or radiculopathy, lumbar region: Secondary | ICD-10-CM

## 2021-05-03 MED ORDER — IOPAMIDOL (ISOVUE-M 200) INJECTION 41%
20.0000 mL | Freq: Once | INTRAMUSCULAR | Status: AC
  Administered 2021-05-03: 20 mL via INTRATHECAL

## 2021-05-03 MED ORDER — DIAZEPAM 5 MG PO TABS
10.0000 mg | ORAL_TABLET | Freq: Once | ORAL | Status: AC
  Administered 2021-05-03: 10 mg via ORAL

## 2021-05-03 MED ORDER — ONDANSETRON HCL 4 MG/2ML IJ SOLN
4.0000 mg | Freq: Once | INTRAMUSCULAR | Status: AC | PRN
  Administered 2021-05-03: 4 mg via INTRAMUSCULAR

## 2021-05-03 MED ORDER — MEPERIDINE HCL 50 MG/ML IJ SOLN
50.0000 mg | Freq: Once | INTRAMUSCULAR | Status: AC | PRN
  Administered 2021-05-03: 50 mg via INTRAMUSCULAR

## 2021-05-03 NOTE — Discharge Instructions (Signed)

## 2021-05-03 NOTE — Discharge Instr - Other Orders (Addendum)
4619: pt reports pain 10/10 from myelogram procedure. Achy, sharp pain in BLE. See MAR.  1030: Relief reported

## 2021-05-17 ENCOUNTER — Other Ambulatory Visit: Payer: Self-pay | Admitting: Neurosurgery

## 2021-06-01 NOTE — Progress Notes (Signed)
Surgical Instructions    Your procedure is scheduled on 06/04/21.  Report to [facility] Main Entrance "A" at 6:00 A.M., then check in with the Admitting office.  Call this number if you have problems the morning of surgery:  [phone]   If you have any questions prior to your surgery date call [phone]: Open Monday-Friday 8am-4pm    Remember:  Do not eat after midnight the night before your surgery  You may drink clear liquids until 5:00 the morning of your surgery.   Clear liquids allowed are: Water, Non-Citrus Juices (without pulp), Carbonated Beverages, Clear Tea, Black Coffee ONLY (NO MILK, CREAM OR POWDERED CREAMER of any kind), and Gatorade    Take these medicines the morning of surgery with A SIP OF WATER:  carboxymethylcellulose (REFRESH PLUS) gabapentin (NEURONTIN)   As Needed: EPINEPHrine  naloxone (NARCAN) oxyCODONE (ROXICODONE)  valACYclovir (VALTREX)    As of today, STOP taking any Aspirin (unless otherwise instructed by your surgeon) Aleve, Naproxen, Ibuprofen, Motrin, Advil, Goody's, BC's, all herbal medications, fish oil, and all vitamins.  WHAT DO I DO ABOUT MY DIABETES MEDICATION?   Do not take oral diabetes medicines (pills) the morning of surgery. DO NOT take METFORMIN morning of surgery.    HOW TO MANAGE YOUR DIABETES BEFORE AND AFTER SURGERY  Why is it important to control my blood sugar before and after surgery? Improving blood sugar levels before and after surgery helps healing and can limit problems. A way of improving blood sugar control is eating a healthy diet by:  Eating less sugar and carbohydrates  Increasing activity/exercise  Talking with your doctor about reaching your blood sugar goals High blood sugars (greater than 180 mg/dL) can raise your risk of infections and slow your recovery, so you will need to focus on controlling your diabetes during the weeks before surgery. Make sure that the doctor who takes care of your diabetes  knows about your planned surgery including the date and location.  How do I manage my blood sugar before surgery? Check your blood sugar at least 4 times a day, starting 2 days before surgery, to make sure that the level is not too high or low.  Check your blood sugar the morning of your surgery when you wake up and every 2 hours until you get to the Short Stay unit.  If your blood sugar is less than 70 mg/dL, you will need to treat for low blood sugar: Do not take insulin. Treat a low blood sugar (less than 70 mg/dL) with  cup of clear juice (cranberry or apple), 4 glucose tablets, OR glucose gel. Recheck blood sugar in 15 minutes after treatment (to make sure it is greater than 70 mg/dL). If your blood sugar is not greater than 70 mg/dL on recheck, call [phone] for further instructions. Report your blood sugar to the short stay nurse when you get to Short Stay.  If you are admitted to the hospital after surgery: Your blood sugar will be checked by the staff and you will probably be given insulin after surgery (instead of oral diabetes medicines) to make sure you have good blood sugar levels. The goal for blood sugar control after surgery is 80-180 mg/dL.    After your COVID test   You are not required to quarantine however you are required to wear a well-fitting mask when you are out and around people not in your household.  If your mask becomes wet or soiled, replace with a new one.  Wash  your hands often with soap and water for 20 seconds or clean your hands with an alcohol-based hand sanitizer that contains at least 60% alcohol.  Do not share personal items.  Notify your provider: if you are in close contact with someone who has COVID  or if you develop a fever of 100.4 or greater, sneezing, cough, sore throat, shortness of breath or body aches.             Do not wear jewelry or makeup Do not wear lotions, powders, perfumes  or deodorant. Do not shave 48 hours prior to  surgery.   Do not bring valuables to the hospital. DO Not wear nail polish, gel polish, artificial nails, or any other type of covering on natural nails including finger and toenails. If patients have artificial nails, gel coating, etc. that need to be removed by a nail salon, please have this removed prior to surgery or surgery may need to be canceled/delayed if the surgeon/ anesthesia feels like the patient is unable to be adequately monitored.             [facility] is not responsible for any belongings or valuables.  Do NOT Smoke (Tobacco/Vaping)  24 hours prior to your procedure  If you use a CPAP at night, you may bring your mask for your overnight stay.   Contacts, glasses, hearing aids, dentures or partials may not be worn into surgery, please bring cases for these belongings   For patients admitted to the hospital, discharge time will be determined by your treatment team.   Patients discharged the day of surgery will not be allowed to drive home, and someone needs to stay with them for 24 hours.  NO VISITORS WILL BE ALLOWED IN PRE-OP WHERE PATIENTS ARE PREPPED FOR SURGERY.  ONLY 1 SUPPORT PERSON MAY BE PRESENT IN THE WAITING ROOM WHILE YOU ARE IN SURGERY.  IF YOU ARE TO BE ADMITTED, ONCE YOU ARE IN YOUR ROOM YOU WILL BE ALLOWED TWO (2) VISITORS. 1 (ONE) VISITOR MAY STAY OVERNIGHT BUT MUST ARRIVE TO THE ROOM BY 8pm.  Minor children may have two parents present. Special consideration for safety and communication needs will be reviewed on a case by case basis.  Special instructions:    Oral Hygiene is also important to reduce your risk of infection.  Remember - BRUSH YOUR TEETH THE MORNING OF SURGERY WITH YOUR REGULAR TOOTHPASTE   [facility]- Preparing For Surgery  Before surgery, you can play an important role. Because skin is not sterile, your skin needs to be as free of germs as possible. You can reduce the number of germs on your skin by washing with CHG (chlorahexidine  gluconate) Soap before surgery.  CHG is an antiseptic cleaner which kills germs and bonds with the skin to continue killing germs even after washing.     Please do not use if you have an allergy to CHG or antibacterial soaps. If your skin becomes reddened/irritated stop using the CHG.  Do not shave (including legs and underarms) for at least 48 hours prior to first CHG shower. It is OK to shave your face.  Please follow these instructions carefully.     Shower the NIGHT BEFORE SURGERY and the MORNING OF SURGERY with CHG Soap.   If you chose to wash your hair, wash your hair first as usual with your normal shampoo. After you shampoo, rinse your hair and body thoroughly to remove the shampoo.  Then [employer] and genitals (private  parts) with your normal soap and rinse thoroughly to remove soap.  After that Use CHG Soap as you would any other liquid soap. You can apply CHG directly to the skin and wash gently with a scrungie or a clean washcloth.   Apply the CHG Soap to your body ONLY FROM THE NECK DOWN.  Do not use on open wounds or open sores. Avoid contact with your eyes, ears, mouth and genitals (private parts). Wash Face and genitals (private parts)  with your normal soap.   Wash thoroughly, paying special attention to the area where your surgery will be performed.  Thoroughly rinse your body with warm water from the neck down.  DO NOT shower/wash with your normal soap after using and rinsing off the CHG Soap.  Pat yourself dry with a CLEAN TOWEL.  Wear CLEAN PAJAMAS to bed the night before surgery  Place CLEAN SHEETS on your bed the night before your surgery  DO NOT SLEEP WITH PETS.   Day of Surgery:  Take a shower with CHG soap. Wear Clean/Comfortable clothing the morning of surgery Do not apply any deodorants/lotions.   Remember to brush your teeth WITH YOUR REGULAR TOOTHPASTE.   Please read over the following fact sheets that you were given.

## 2021-06-02 ENCOUNTER — Encounter (HOSPITAL_COMMUNITY)
Admission: RE | Admit: 2021-06-02 | Discharge: 2021-06-02 | Disposition: A | Discharge status: Home / Self Care | Payer: Medicare Other | Source: Ambulatory Visit | Attending: Neurosurgery | Admitting: Neurosurgery

## 2021-06-02 ENCOUNTER — Encounter (HOSPITAL_COMMUNITY): Payer: Self-pay

## 2021-06-02 ENCOUNTER — Other Ambulatory Visit: Payer: Self-pay

## 2021-06-02 VITALS — BP 157/84 | HR 79 | Temp 97.9°F | Resp 17 | Ht 65.0 in | Wt 175.0 lb

## 2021-06-02 DIAGNOSIS — Z01812 Encounter for preprocedural laboratory examination: Secondary | ICD-10-CM | POA: Diagnosis not present

## 2021-06-02 DIAGNOSIS — Z20822 Contact with and (suspected) exposure to covid-19: Secondary | ICD-10-CM | POA: Insufficient documentation

## 2021-06-02 DIAGNOSIS — Z01818 Encounter for other preprocedural examination: Secondary | ICD-10-CM

## 2021-06-02 HISTORY — DX: Personal history of urinary calculi: Z87.442

## 2021-06-02 HISTORY — DX: Anemia, unspecified: D64.9

## 2021-06-02 LAB — BASIC METABOLIC PANEL
Anion gap: 8 (ref 5–15)
BUN: 10 mg/dL (ref 6–20)
CO2: 24 mmol/L (ref 22–32)
Calcium: 9.4 mg/dL (ref 8.9–10.3)
Chloride: 108 mmol/L (ref 98–111)
Creatinine, Ser: 0.78 mg/dL (ref 0.44–1.00)
GFR, Estimated: >60 mL/min (ref >60)
Glucose, Bld: 98 mg/dL (ref 70–99)
Potassium: 3.6 mmol/L (ref 3.5–5.1)
Sodium: 140 mmol/L (ref 135–145)

## 2021-06-02 LAB — SURGICAL PCR SCREEN
MRSA, PCR: NEGATIVE
Staphylococcus aureus: NEGATIVE

## 2021-06-02 LAB — GLUCOSE, CAPILLARY: Glucose-Capillary: 116 mg/dL — ABNORMAL HIGH (ref 70–99)

## 2021-06-02 LAB — CBC WITH DIFFERENTIAL/PLATELET
Abs Immature Granulocytes: 0.01 10*3/uL (ref 0.00–0.07)
Basophils Absolute: 0 10*3/uL (ref 0.0–0.1)
Basophils Relative: 1 %
Eosinophils Absolute: 0.2 10*3/uL (ref 0.0–0.5)
Eosinophils Relative: 2 %
HCT: 39.3 % (ref 36.0–46.0)
Hemoglobin: 12.8 g/dL (ref 12.0–15.0)
Immature Granulocytes: 0 %
Lymphocytes Relative: 41 %
Lymphs Abs: 2.7 10*3/uL (ref 0.7–4.0)
MCH: 29.8 pg (ref 26.0–34.0)
MCHC: 32.6 g/dL (ref 30.0–36.0)
MCV: 91.4 fL (ref 80.0–100.0)
Monocytes Absolute: 0.4 10*3/uL (ref 0.1–1.0)
Monocytes Relative: 6 %
Neutro Abs: 3.3 10*3/uL (ref 1.7–7.7)
Neutrophils Relative %: 50 %
Platelets: 198 10*3/uL (ref 150–400)
RBC: 4.3 MIL/uL (ref 3.87–5.11)
RDW: 13.2 % (ref 11.5–15.5)
WBC: 6.5 10*3/uL (ref 4.0–10.5)
nRBC: 0 % (ref 0.0–0.2)

## 2021-06-02 LAB — SARS CORONAVIRUS 2 (TAT 6-24 HRS): SARS Coronavirus 2: NEGATIVE

## 2021-06-02 NOTE — Progress Notes (Signed)
PCP: Ellender Hose, MD Cardiologist: denies  EKG: 06/08/20 CXR: 06/07/20 ECHO: denies Stress Test: denies Cardiac Cath: denies  Fasting Blood Sugar- 70's - 130's Checks Blood Sugar__1_ times a day  OSA/CPAP: No  ASA/Blood Thinner: No  Covid test 06/02/21 at PAT  Anesthesia Review: No  Patient denies shortness of breath, fever, cough, and chest pain at PAT appointment.  Patient verbalized understanding of instructions provided today at the PAT appointment.  Patient asked to review instructions at home and day of surgery.

## 2021-06-03 ENCOUNTER — Encounter (HOSPITAL_COMMUNITY): Payer: Self-pay | Admitting: Certified Registered Nurse Anesthetist

## 2021-06-03 LAB — HEMOGLOBIN A1C
Hgb A1c MFr Bld: 5.7 % — ABNORMAL HIGH (ref 4.8–5.6)
Mean Plasma Glucose: 117 mg/dL

## 2021-06-04 ENCOUNTER — Ambulatory Visit (HOSPITAL_COMMUNITY): Admission: RE | Admit: 2021-06-04 | Payer: Medicare Other | Source: Ambulatory Visit | Admitting: Neurosurgery

## 2021-06-04 ENCOUNTER — Encounter (HOSPITAL_COMMUNITY): Admission: RE | Payer: Self-pay | Source: Ambulatory Visit

## 2021-06-04 SURGERY — POSTERIOR LUMBAR FUSION 1 LEVEL
Anesthesia: General | Site: Back

## 2021-06-07 LAB — TYPE AND SCREEN
ABO/RH(D): O NEG
Antibody Screen: POSITIVE
Donor AG Type: NEGATIVE
Donor AG Type: NEGATIVE
Unit division: 0
Unit division: 0

## 2021-06-07 LAB — BPAM RBC
Blood Product Expiration Date: 202212272359
Blood Product Expiration Date: 202212272359
Unit Type and Rh: 9500
Unit Type and Rh: 9500

## 2021-08-02 ENCOUNTER — Other Ambulatory Visit (HOSPITAL_BASED_OUTPATIENT_CLINIC_OR_DEPARTMENT_OTHER): Payer: Self-pay

## 2021-08-02 MED ORDER — OZEMPIC (2 MG/DOSE) 8 MG/3ML ~~LOC~~ SOPN
PEN_INJECTOR | SUBCUTANEOUS | 1 refills | Status: AC
  Filled 2021-08-02: qty 9, 84d supply, fill #0

## 2022-02-24 IMAGING — CR DG MYELOGRAPHY LUMBAR INJ LUMBOSACRAL
14 of 18 series · 14 of 18 positions shown · non-contrast
Comparison: None

CLINICAL DATA: Back pain, lumbar arthropathy

[vasc standard (1 of 12)]
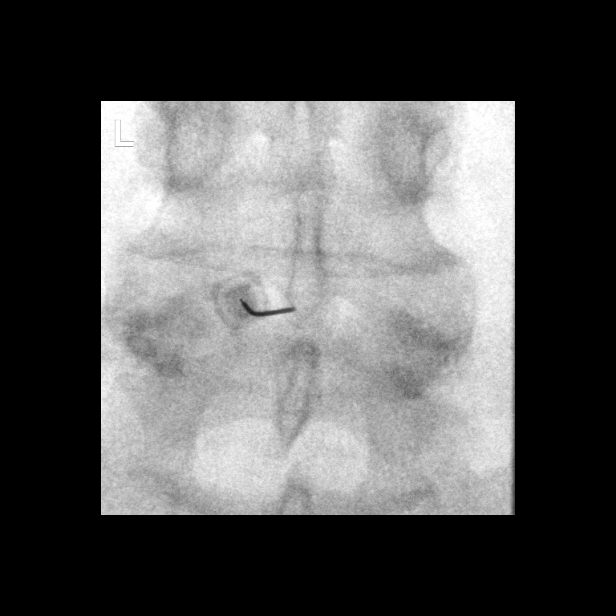

[w lumbar spine ap]
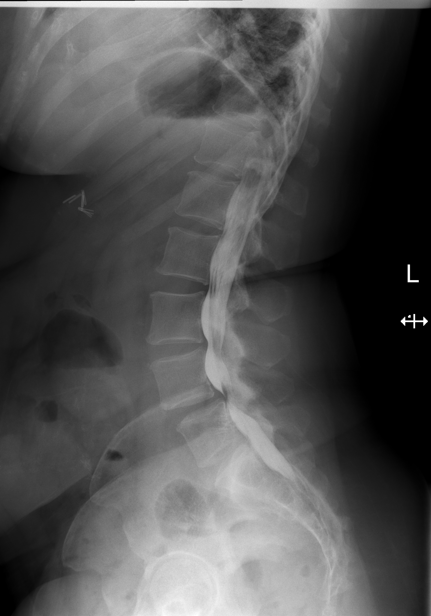

[vasc standard (2 of 12)]
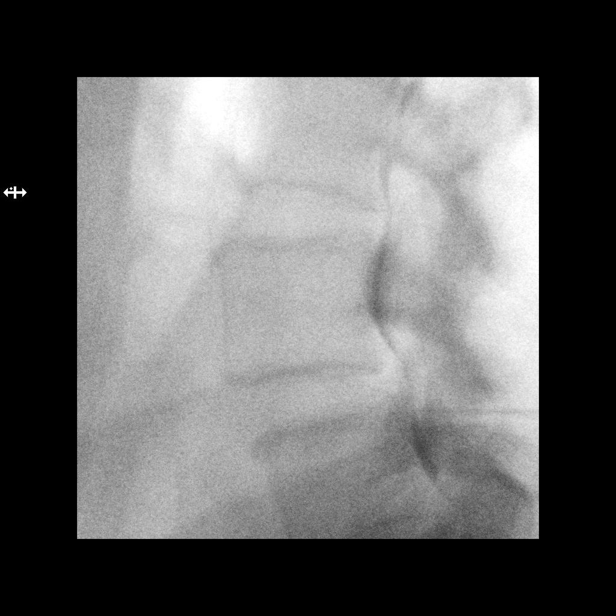

[w lumbar spine flexion]
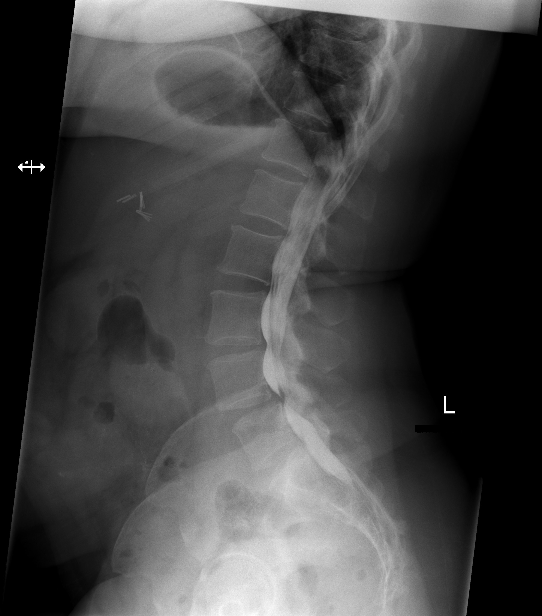

[vasc standard (3 of 12)]
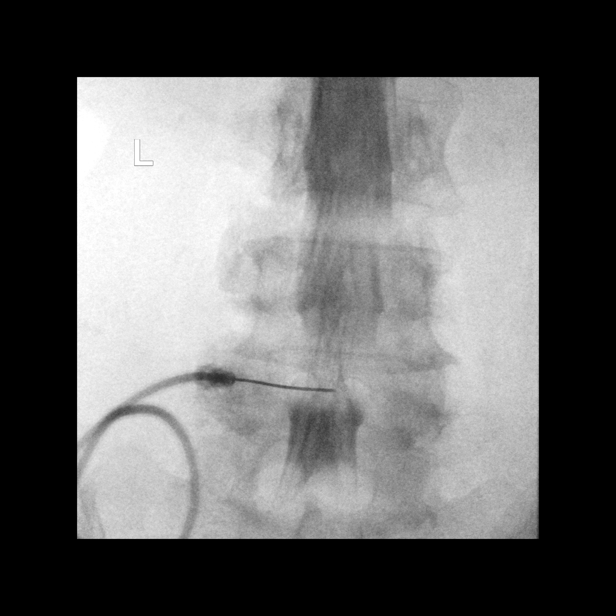

[vasc standard (4 of 12)]
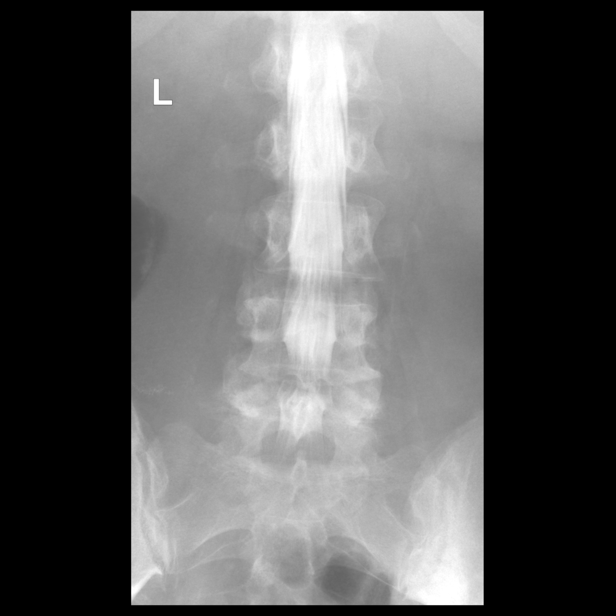

[vasc standard (5 of 12)]
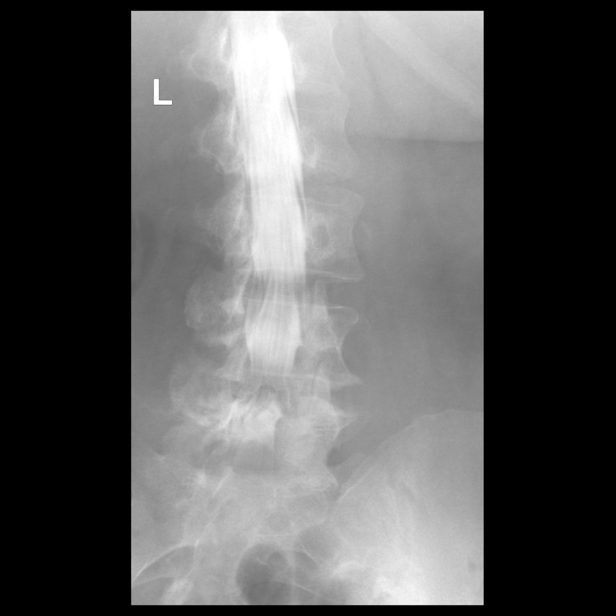

[vasc standard (6 of 12)]
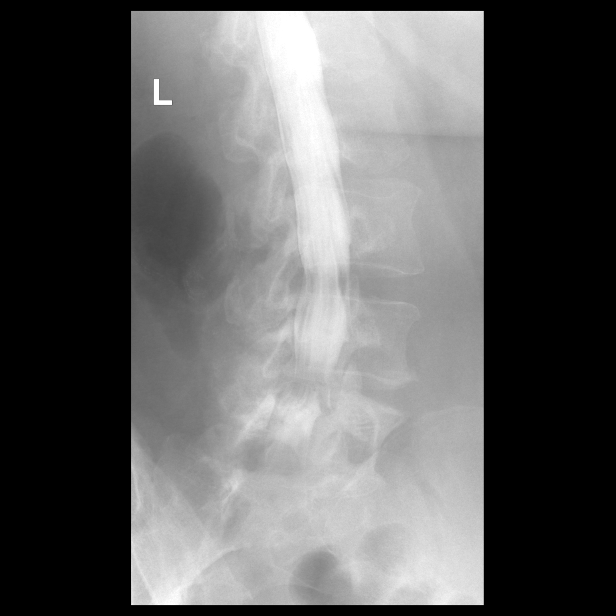

[vasc standard (7 of 12)]
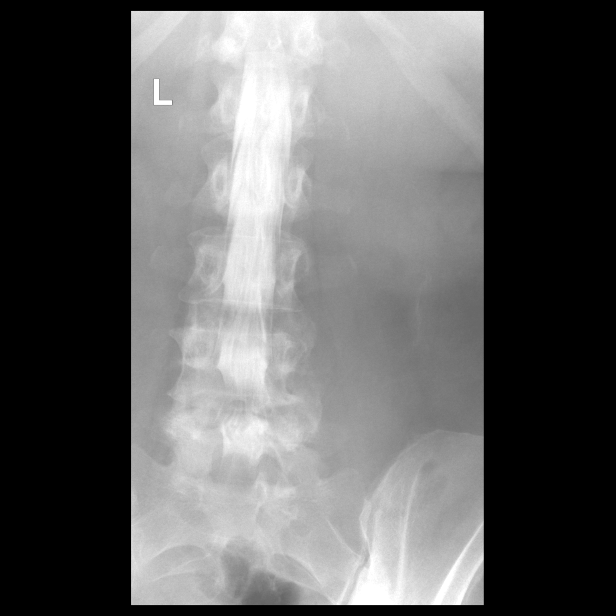

[vasc standard (8 of 12)]
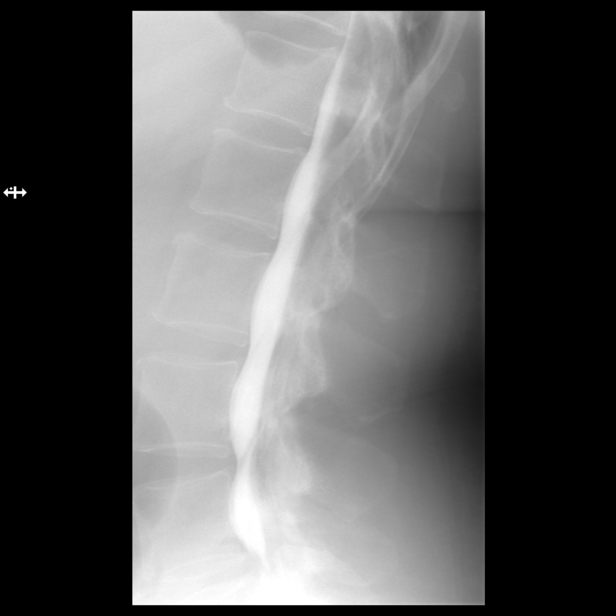

[vasc standard (9 of 12)]
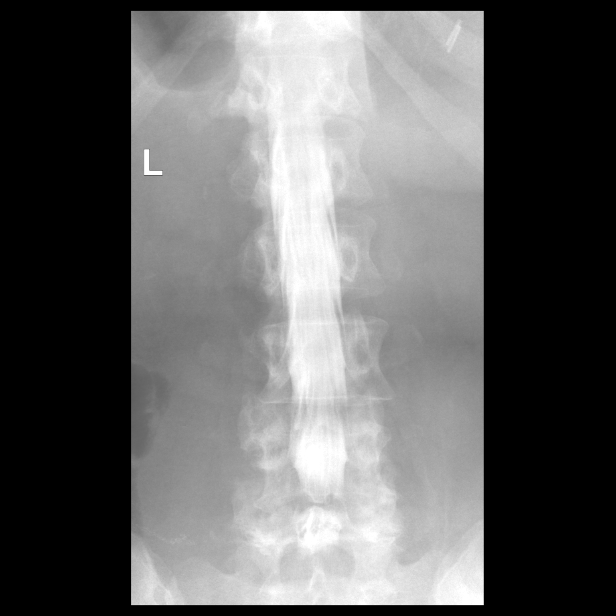

[vasc standard (10 of 12)]
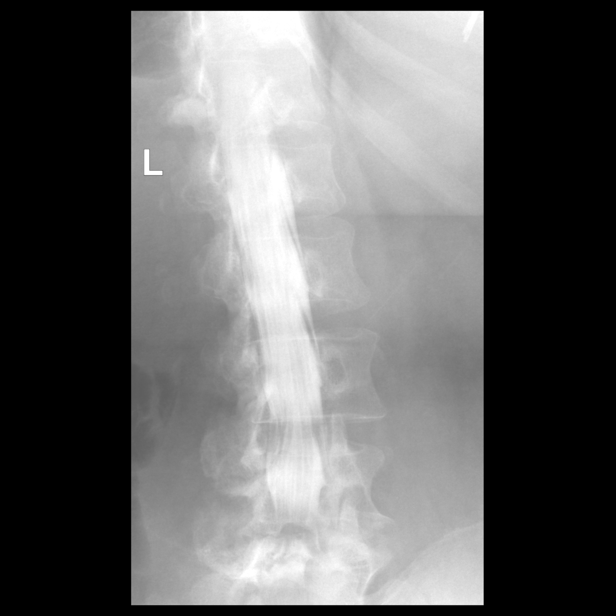

[vasc standard (11 of 12)]
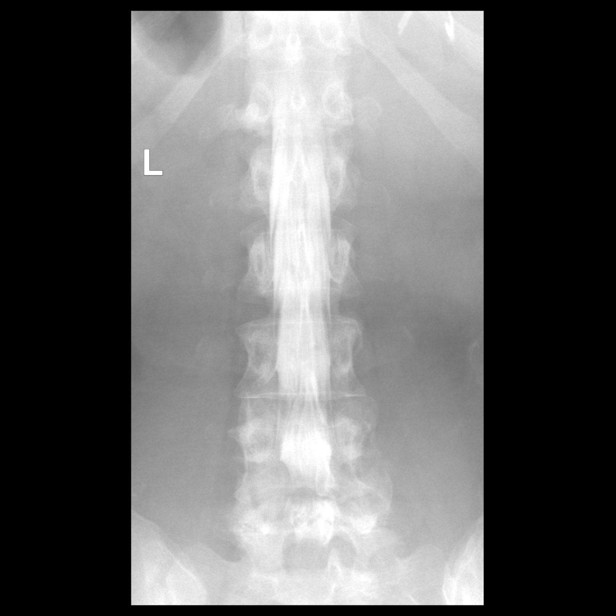

[vasc standard (12 of 12)]
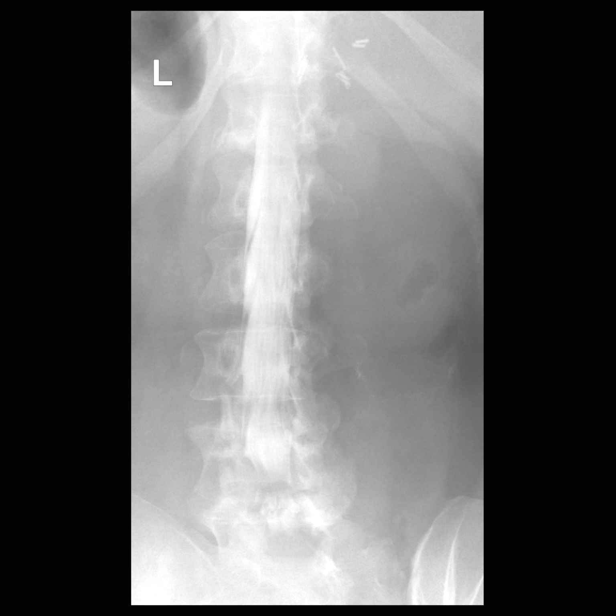

[14 of 18 positions shown; findings below may reference images not displayed]

FLUOROSCOPY TIME:  1 minute 4 seconds with 16 exposures

PROCEDURE:
LUMBAR MYELOGRAM

After thorough discussion of risks and benefits of the procedure
including bleeding, infection, injury to nerves, blood vessels,
adjacent structures as well as headache and CSF leak, written and
oral informed consent was obtained. Consent was obtained by Dr.
Giorgi Jumper. Time out form was completed.

Patient was positioned prone on the fluoroscopy table. Local
anesthesia was provided with 1% lidocaine without epinephrine after
prepped and draped in the usual sterile fashion. Puncture was
performed at L4-L5 using a 22-gauge spinal needle via left
paramedian approach. Using a single pass through the dura, the
needle was placed within the thecal sac, with return of clear CSF.
15 mL of Isovue C-SNN was injected into the thecal sac, with normal
opacification of the nerve roots and cauda equina consistent with
free flow within the subarachnoid space.

I personally performed the lumbar puncture and administered the
intrathecal contrast. I also personally supervised acquisition of
the myelogram images.
FINDINGS: Lumbar myelogram was completed in the AP, lateral and multiple
oblique projections. There is free flow contrast material cranially.
The nerve roots appear free flowing. There are multilevel anterior
thecal sac indentation which appear most prominent at L4-L5 with
mild anterolisthesis of L4 on L5 at that level.
IMPRESSION: Lumbar puncture using fluoroscopy with myelogram as described.
Attention on forthcoming CT myelogram.

## 2022-02-24 IMAGING — CT CT L SPINE W/ CM
1 of 7 series · 6 of 14 positions shown, 8 images · IV contrast (isovue)
Comparison: MRI of the lumbar spine October 14, 2020

CLINICAL DATA: Lumbar facet arthropathy.

EXAM:
CT MYELOGRAPHY LUMBAR SPINE
TECHNIQUE: CT imaging of the lumbar spine was performed after Isovue 200M
contrast administration. Multiplanar CT image reconstructions were
also generated.

[Series 3: l spine soft · axial · 0.25mm/px · z∈[+29,+183]mm · 6 of 109 slices shown, 8 images]
[im 16/109  soft-tissue]
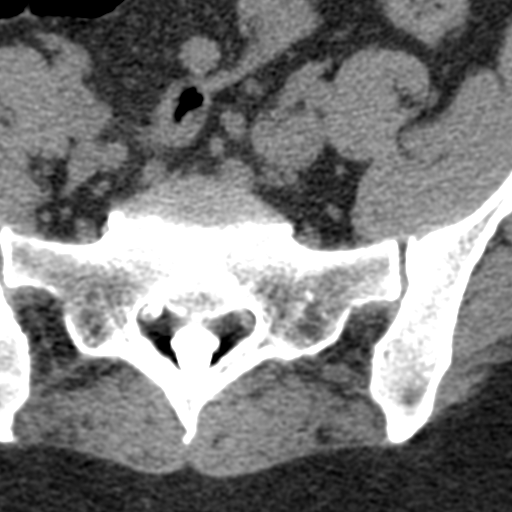
[im 16/109  bone]
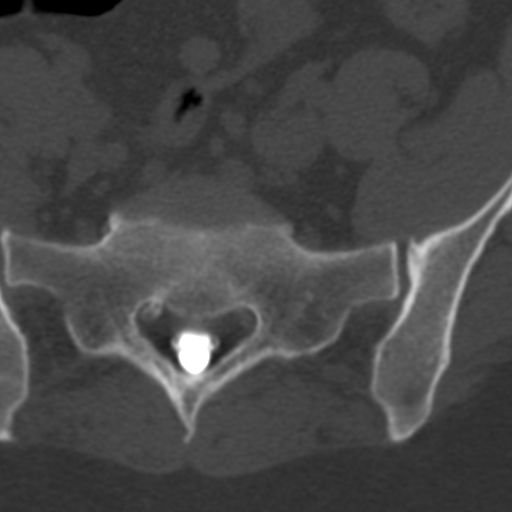
[im 31/109  bone]
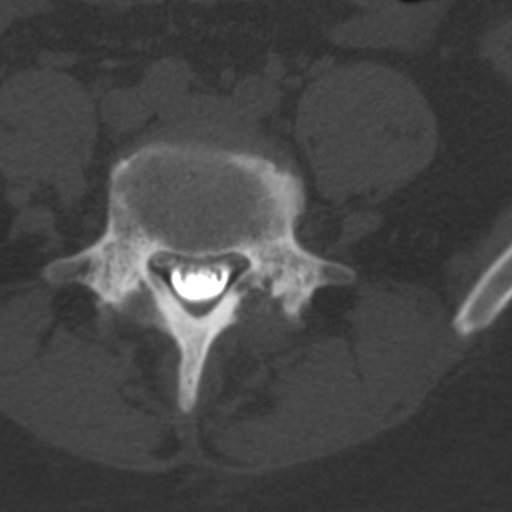
[im 47/109  bone]
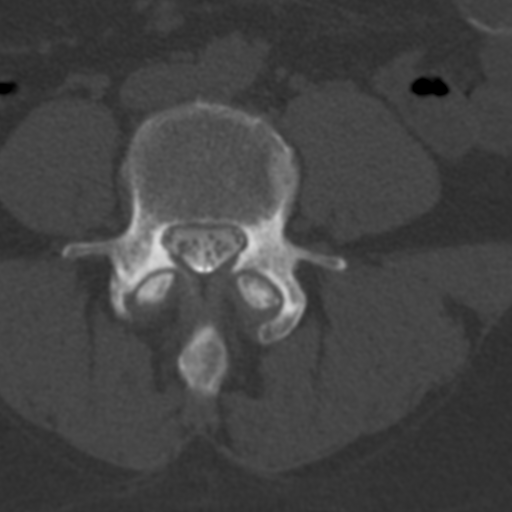
[im 62/109  bone]
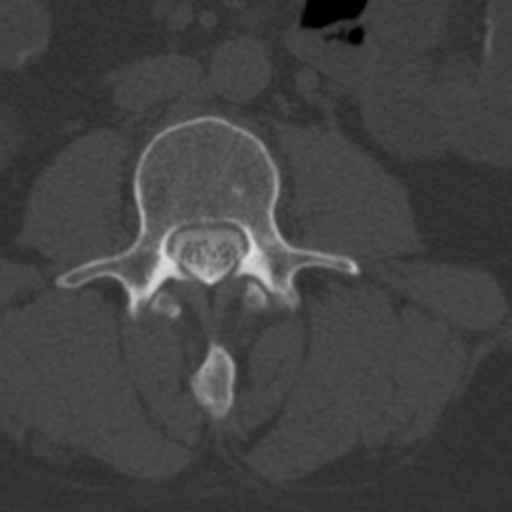
[im 78/109  soft-tissue]
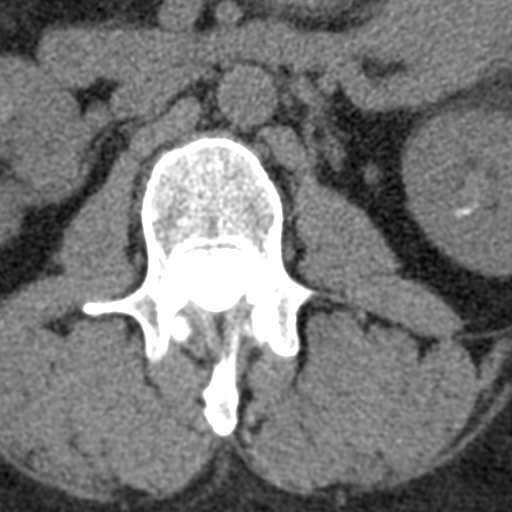
[im 78/109  bone]
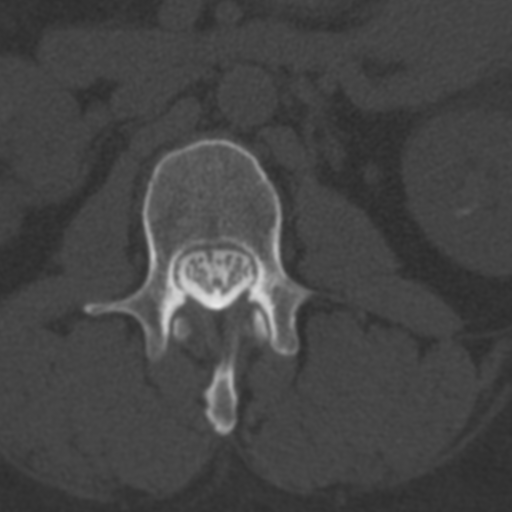
[im 93/109  bone]
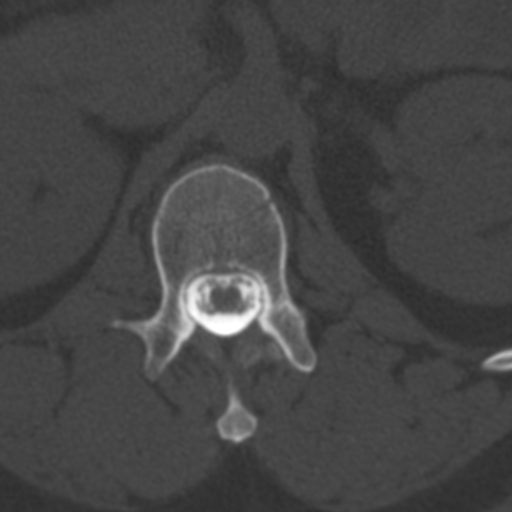

[6 of 14 positions shown; findings below may reference images not displayed]

FINDINGS: Intrathecal contrast injection performed by Dr. Jerrid Awan under
fluoroscopy. Lumbar myelogram reported separately.

Segmentation:  5 lumbar type vertebral bodies.

Alignment: Trace anterolisthesis of L4 over L5.

Vertebrae: No fracture, evidence of discitis, or bone lesion.

Conus medullaris: Extends to the L2 level and appears normal. Normal
opacification of the thecal sac with free-floating roots of the
cauda volume.

Paraspinal and other soft tissues: A 4 mm nonobstructing left renal
calculus.

Disc levels:

T12-L1: No spinal canal or neural foraminal stenosis.

L1-2: No spinal canal or neural foraminal stenosis.

L2-3: Mild facet degenerative changes. No spinal canal or neural
foraminal stenosis.

L3-4: Shallow disc bulge, moderate right and moderate to advanced
left facet degenerative changes and ligamentum flavum redundancy
resulting in minimal spinal canal stenosis. No significant neural
foraminal narrowing.

L4-5: Loss of disc bulge, moderate to advanced hypertrophic facet
degenerative changes with subchondral cyst related to the mentum
flavum redundancy resulting in moderate spinal canal, mild right and
moderate left neural foraminal narrowing, similar to prior MRI.

L5-S1: Shallow disc bulge and mild facet degenerative changes. No
spinal canal or neural foraminal stenosis.
IMPRESSION: 1. Degenerative changes at L4-5, more pronounced at the level of the
facet joints, resulting in moderate spinal canal stenosis with mild
right and moderate left neural foraminal narrowing, similar to prior
MRI.
2. Degenerative changes at L4-5 resulting in minimal spinal canal
stenosis.

## 2024-07-09 ENCOUNTER — Other Ambulatory Visit (HOSPITAL_COMMUNITY): Payer: Self-pay
